# Patient Record
Sex: Female | Born: 1940
Health system: Southern US, Community
[De-identification: ages and names within clinical notes are randomized; demographics above are authoritative.]

## PROBLEM LIST (undated history)

## (undated) DIAGNOSIS — I10 Essential (primary) hypertension: Secondary | ICD-10-CM

## (undated) DIAGNOSIS — E039 Hypothyroidism, unspecified: Secondary | ICD-10-CM

## (undated) DIAGNOSIS — I483 Typical atrial flutter: Secondary | ICD-10-CM

## (undated) DIAGNOSIS — I5020 Unspecified systolic (congestive) heart failure: Secondary | ICD-10-CM

## (undated) DIAGNOSIS — C4359 Malignant melanoma of other part of trunk: Secondary | ICD-10-CM

## (undated) HISTORY — PX: OTHER SURGICAL HISTORY: SHX169

## (undated) HISTORY — DX: Essential (primary) hypertension: I10

## (undated) HISTORY — DX: Hypothyroidism, unspecified: E03.9

## (undated) HISTORY — DX: Typical atrial flutter: I48.3

## (undated) HISTORY — PX: KNEE ARTHROSCOPY: SUR90

## (undated) HISTORY — PX: TONSILLECTOMY: SUR1361

## (undated) HISTORY — DX: Unspecified systolic (congestive) heart failure: I50.20

## (undated) HISTORY — PX: ABDOMINAL HYSTERECTOMY: SHX81

## (undated) HISTORY — PX: CHOLECYSTECTOMY: SHX55

---

## 2011-06-10 DIAGNOSIS — E782 Mixed hyperlipidemia: Secondary | ICD-10-CM | POA: Diagnosis not present

## 2011-06-15 DIAGNOSIS — E669 Obesity, unspecified: Secondary | ICD-10-CM | POA: Diagnosis not present

## 2011-06-15 DIAGNOSIS — IMO0002 Reserved for concepts with insufficient information to code with codable children: Secondary | ICD-10-CM | POA: Diagnosis not present

## 2011-06-15 DIAGNOSIS — J309 Allergic rhinitis, unspecified: Secondary | ICD-10-CM | POA: Diagnosis not present

## 2011-06-15 DIAGNOSIS — E039 Hypothyroidism, unspecified: Secondary | ICD-10-CM | POA: Diagnosis not present

## 2011-06-15 DIAGNOSIS — L2089 Other atopic dermatitis: Secondary | ICD-10-CM | POA: Diagnosis not present

## 2011-06-15 DIAGNOSIS — E782 Mixed hyperlipidemia: Secondary | ICD-10-CM | POA: Diagnosis not present

## 2011-12-08 DIAGNOSIS — E7800 Pure hypercholesterolemia, unspecified: Secondary | ICD-10-CM | POA: Diagnosis not present

## 2011-12-08 DIAGNOSIS — E039 Hypothyroidism, unspecified: Secondary | ICD-10-CM | POA: Diagnosis not present

## 2011-12-08 DIAGNOSIS — E782 Mixed hyperlipidemia: Secondary | ICD-10-CM | POA: Diagnosis not present

## 2011-12-08 DIAGNOSIS — E669 Obesity, unspecified: Secondary | ICD-10-CM | POA: Diagnosis not present

## 2011-12-15 DIAGNOSIS — L2089 Other atopic dermatitis: Secondary | ICD-10-CM | POA: Diagnosis not present

## 2011-12-15 DIAGNOSIS — IMO0002 Reserved for concepts with insufficient information to code with codable children: Secondary | ICD-10-CM | POA: Diagnosis not present

## 2011-12-15 DIAGNOSIS — E782 Mixed hyperlipidemia: Secondary | ICD-10-CM | POA: Diagnosis not present

## 2011-12-15 DIAGNOSIS — E039 Hypothyroidism, unspecified: Secondary | ICD-10-CM | POA: Diagnosis not present

## 2011-12-15 DIAGNOSIS — J309 Allergic rhinitis, unspecified: Secondary | ICD-10-CM | POA: Diagnosis not present

## 2011-12-15 DIAGNOSIS — E669 Obesity, unspecified: Secondary | ICD-10-CM | POA: Diagnosis not present

## 2012-02-26 DIAGNOSIS — Z23 Encounter for immunization: Secondary | ICD-10-CM | POA: Diagnosis not present

## 2012-04-08 DIAGNOSIS — Z1231 Encounter for screening mammogram for malignant neoplasm of breast: Secondary | ICD-10-CM | POA: Diagnosis not present

## 2012-04-13 DIAGNOSIS — D1800 Hemangioma unspecified site: Secondary | ICD-10-CM | POA: Diagnosis not present

## 2012-04-13 DIAGNOSIS — L821 Other seborrheic keratosis: Secondary | ICD-10-CM | POA: Diagnosis not present

## 2012-04-13 DIAGNOSIS — L570 Actinic keratosis: Secondary | ICD-10-CM | POA: Diagnosis not present

## 2012-06-09 DIAGNOSIS — E782 Mixed hyperlipidemia: Secondary | ICD-10-CM | POA: Diagnosis not present

## 2012-06-17 DIAGNOSIS — J309 Allergic rhinitis, unspecified: Secondary | ICD-10-CM | POA: Diagnosis not present

## 2012-06-17 DIAGNOSIS — E782 Mixed hyperlipidemia: Secondary | ICD-10-CM | POA: Diagnosis not present

## 2012-06-17 DIAGNOSIS — E039 Hypothyroidism, unspecified: Secondary | ICD-10-CM | POA: Diagnosis not present

## 2012-07-25 DIAGNOSIS — S139XXA Sprain of joints and ligaments of unspecified parts of neck, initial encounter: Secondary | ICD-10-CM | POA: Diagnosis not present

## 2012-09-22 DIAGNOSIS — G43909 Migraine, unspecified, not intractable, without status migrainosus: Secondary | ICD-10-CM | POA: Diagnosis not present

## 2012-09-23 DIAGNOSIS — G44309 Post-traumatic headache, unspecified, not intractable: Secondary | ICD-10-CM | POA: Diagnosis not present

## 2012-12-22 DIAGNOSIS — E669 Obesity, unspecified: Secondary | ICD-10-CM | POA: Diagnosis not present

## 2012-12-22 DIAGNOSIS — E039 Hypothyroidism, unspecified: Secondary | ICD-10-CM | POA: Diagnosis not present

## 2012-12-22 DIAGNOSIS — E782 Mixed hyperlipidemia: Secondary | ICD-10-CM | POA: Diagnosis not present

## 2012-12-28 DIAGNOSIS — E039 Hypothyroidism, unspecified: Secondary | ICD-10-CM | POA: Diagnosis not present

## 2012-12-28 DIAGNOSIS — Z0000 Encounter for general adult medical examination without abnormal findings: Secondary | ICD-10-CM | POA: Diagnosis not present

## 2012-12-28 DIAGNOSIS — J309 Allergic rhinitis, unspecified: Secondary | ICD-10-CM | POA: Diagnosis not present

## 2012-12-28 DIAGNOSIS — E669 Obesity, unspecified: Secondary | ICD-10-CM | POA: Diagnosis not present

## 2012-12-28 DIAGNOSIS — E782 Mixed hyperlipidemia: Secondary | ICD-10-CM | POA: Diagnosis not present

## 2012-12-29 ENCOUNTER — Encounter (INDEPENDENT_AMBULATORY_CARE_PROVIDER_SITE_OTHER): Payer: Self-pay | Admitting: *Deleted

## 2013-02-17 DIAGNOSIS — Z23 Encounter for immunization: Secondary | ICD-10-CM | POA: Diagnosis not present

## 2013-03-09 ENCOUNTER — Encounter (INDEPENDENT_AMBULATORY_CARE_PROVIDER_SITE_OTHER): Payer: Self-pay | Admitting: *Deleted

## 2013-03-09 ENCOUNTER — Telehealth (INDEPENDENT_AMBULATORY_CARE_PROVIDER_SITE_OTHER): Payer: Self-pay | Admitting: *Deleted

## 2013-03-09 ENCOUNTER — Other Ambulatory Visit (INDEPENDENT_AMBULATORY_CARE_PROVIDER_SITE_OTHER): Payer: Self-pay | Admitting: *Deleted

## 2013-03-09 DIAGNOSIS — Z800 Family history of malignant neoplasm of digestive organs: Secondary | ICD-10-CM

## 2013-03-09 DIAGNOSIS — Z1211 Encounter for screening for malignant neoplasm of colon: Secondary | ICD-10-CM

## 2013-03-09 NOTE — Telephone Encounter (Signed)
Patient needs movi prep 

## 2013-03-10 MED ORDER — PEG-KCL-NACL-NASULF-NA ASC-C 100 G PO SOLR: 1.0000 | Freq: Once | ORAL | Status: DC

## 2013-03-30 ENCOUNTER — Ambulatory Visit (INDEPENDENT_AMBULATORY_CARE_PROVIDER_SITE_OTHER): Payer: Medicare Other | Admitting: Internal Medicine

## 2013-03-30 ENCOUNTER — Encounter (INDEPENDENT_AMBULATORY_CARE_PROVIDER_SITE_OTHER): Payer: Self-pay | Admitting: Internal Medicine

## 2013-03-30 VITALS — BP 170/68 | HR 64 | Temp 97.6°F | Ht 65.0 in | Wt 175.1 lb

## 2013-03-30 DIAGNOSIS — I10 Essential (primary) hypertension: Secondary | ICD-10-CM | POA: Insufficient documentation

## 2013-03-30 DIAGNOSIS — R1310 Dysphagia, unspecified: Secondary | ICD-10-CM

## 2013-03-30 DIAGNOSIS — E039 Hypothyroidism, unspecified: Secondary | ICD-10-CM | POA: Insufficient documentation

## 2013-03-30 NOTE — Progress Notes (Signed)
Subjective:     Patient ID: Kaylee Gilbert, female   DOB: 1941-03-29, 71 y.o.   MRN: [id number]  HPI Kaylee Gilbert is a 72 yr old female here today. She is scheduled for a screening colonoscopy 05/17/2013 for family hx of colon cancer (father age 89). Today, she c/o choking sensation on occasion when she eats..  Foods feel like they are lodging in her esophagus. When this occurs she cannot even swallowing water.  Occuring once a month. No foods in particular.  She has to sit still for the food to go down.  Appetite is good. No unintentional weight loss. No abdominal pain. She occasionally indigestion especially with fried foods. She takes Tums when needed. She usually has a BM daily. No melena or bright red rectal bleeding.  Review of Systems Current Outpatient Prescriptions  Medication Sig Dispense Refill  . cetirizine (ZYRTEC) 10 MG tablet Take 10 mg by mouth daily.      Marland Kitchen levothyroxine (SYNTHROID, LEVOTHROID) 75 MCG tablet Take 75 mcg by mouth daily before breakfast.      . peg 3350 powder (MOVIPREP) 100 G SOLR Take 1 kit (200 g total) by mouth once.  1 kit  0   No current facility-administered medications for this visit.   Past Medical History  Diagnosis Date  . Hypertension   . Hypothyroid    Past Surgical History  Procedure Laterality Date  . Knee arthroscopy      RT 1998  . Tonsillectomy    . Complete hysterectomy      2004, fibroids, prolapse  . Cholecystectomy  around 2010   No Known Allergies      Objective:   Physical Exam  Filed Vitals:   03/30/13 0944  BP: 170/68  Pulse: 64  Temp: 97.6 F (36.4 C)  Height: 5\' 5"  (1.651 m)  Weight: 175 lb 1.6 oz (79.425 kg)   Alert and oriented. Skin warm and dry. Oral mucosa is moist.   . Sclera anicteric, conjunctivae is pink. Thyroid not enlarged. No cervical lymphadenopathy. Lungs clear. Heart regular rate and rhythm.  Abdomen is soft. Bowel sounds are positive. No hepatomegaly. No abdominal masses felt. No tenderness.  No edema  to lower extremities.       Assessment:   Solid food Dysphagia which is not occurring frequently. Occurs about once a month. Stricture needs to be ruled out.     Plan:    Esophagram. Further recommendations to follow

## 2013-03-30 NOTE — Patient Instructions (Signed)
Esophagram.  

## 2013-03-31 ENCOUNTER — Ambulatory Visit (HOSPITAL_COMMUNITY)
Admission: RE | Admit: 2013-03-31 | Discharge: 2013-03-31 | Disposition: A | Discharge status: Home / Self Care | Payer: Medicare Other | Source: Ambulatory Visit | Attending: Internal Medicine | Admitting: Internal Medicine

## 2013-03-31 DIAGNOSIS — K219 Gastro-esophageal reflux disease without esophagitis: Secondary | ICD-10-CM | POA: Diagnosis not present

## 2013-03-31 DIAGNOSIS — R1310 Dysphagia, unspecified: Secondary | ICD-10-CM | POA: Diagnosis not present

## 2013-03-31 DIAGNOSIS — K224 Dyskinesia of esophagus: Secondary | ICD-10-CM | POA: Insufficient documentation

## 2013-04-01 DIAGNOSIS — C4359 Malignant melanoma of other part of trunk: Secondary | ICD-10-CM

## 2013-04-01 HISTORY — DX: Malignant melanoma of other part of trunk: C43.59

## 2013-04-10 DIAGNOSIS — Z1231 Encounter for screening mammogram for malignant neoplasm of breast: Secondary | ICD-10-CM | POA: Diagnosis not present

## 2013-04-17 DIAGNOSIS — D485 Neoplasm of uncertain behavior of skin: Secondary | ICD-10-CM | POA: Diagnosis not present

## 2013-04-17 DIAGNOSIS — L570 Actinic keratosis: Secondary | ICD-10-CM | POA: Diagnosis not present

## 2013-04-17 DIAGNOSIS — L821 Other seborrheic keratosis: Secondary | ICD-10-CM | POA: Diagnosis not present

## 2013-04-19 ENCOUNTER — Telehealth (INDEPENDENT_AMBULATORY_CARE_PROVIDER_SITE_OTHER): Payer: Self-pay | Admitting: *Deleted

## 2013-04-19 NOTE — Telephone Encounter (Signed)
  Procedure: tcs   Reason/Indication:  fam hx colon ca  Has patient had this procedure before?  Yes, 7-8 yrs ago  If so, when, by whom and where?    Is there a family history of colon cancer?  father  Who?  What age when diagnosed?    Is patient diabetic?   no      Does patient have prosthetic heart valve?  no  Do you have a pacemaker?  no  Has patient ever had endocarditis? no  Has patient had joint replacement within last 12 months?  no  Does patient tend to be constipated or take laxatives? no  Is patient on Coumadin, Plavix and/or Aspirin? yes  Medications: asa prn, zyrtec daily, levothyroxine 75 mcg daily  Allergies: nkda  Medication Adjustment: asa 2 days  Procedure date & time: 05/17/13 at 730

## 2013-04-19 NOTE — Telephone Encounter (Signed)
agree

## 2013-04-25 DIAGNOSIS — D485 Neoplasm of uncertain behavior of skin: Secondary | ICD-10-CM | POA: Diagnosis not present

## 2013-05-03 ENCOUNTER — Encounter (HOSPITAL_COMMUNITY): Payer: Self-pay | Admitting: Pharmacy Technician

## 2013-05-11 ENCOUNTER — Encounter (INDEPENDENT_AMBULATORY_CARE_PROVIDER_SITE_OTHER): Payer: Self-pay

## 2013-05-17 ENCOUNTER — Encounter (HOSPITAL_COMMUNITY): Payer: Self-pay | Admitting: *Deleted

## 2013-05-17 ENCOUNTER — Encounter (HOSPITAL_COMMUNITY)
Admission: RE | Disposition: A | Discharge status: Home / Self Care | Payer: Self-pay | Source: Ambulatory Visit | Attending: Internal Medicine

## 2013-05-17 ENCOUNTER — Ambulatory Visit (HOSPITAL_COMMUNITY)
Admission: RE | Admit: 2013-05-17 | Discharge: 2013-05-17 | Disposition: A | Discharge status: Home / Self Care | Payer: Medicare Other | Source: Ambulatory Visit | Attending: Internal Medicine | Admitting: Internal Medicine

## 2013-05-17 DIAGNOSIS — I10 Essential (primary) hypertension: Secondary | ICD-10-CM | POA: Insufficient documentation

## 2013-05-17 DIAGNOSIS — K5730 Diverticulosis of large intestine without perforation or abscess without bleeding: Secondary | ICD-10-CM | POA: Insufficient documentation

## 2013-05-17 DIAGNOSIS — Z800 Family history of malignant neoplasm of digestive organs: Secondary | ICD-10-CM | POA: Insufficient documentation

## 2013-05-17 DIAGNOSIS — Z1211 Encounter for screening for malignant neoplasm of colon: Secondary | ICD-10-CM | POA: Diagnosis not present

## 2013-05-17 DIAGNOSIS — D126 Benign neoplasm of colon, unspecified: Secondary | ICD-10-CM | POA: Diagnosis not present

## 2013-05-17 HISTORY — PX: COLONOSCOPY: SHX5424

## 2013-05-17 HISTORY — DX: Malignant melanoma of other part of trunk: C43.59

## 2013-05-17 SURGERY — COLONOSCOPY
Anesthesia: Moderate Sedation

## 2013-05-17 MED ORDER — MEPERIDINE HCL 50 MG/ML IJ SOLN
INTRAMUSCULAR | Status: AC
  Filled 2013-05-17: qty 1

## 2013-05-17 MED ORDER — MIDAZOLAM HCL 5 MG/5ML IJ SOLN
INTRAMUSCULAR | Status: AC
  Filled 2013-05-17: qty 10

## 2013-05-17 MED ORDER — STERILE WATER FOR IRRIGATION IR SOLN
Status: DC | PRN
  Administered 2013-05-17: 07:00:00

## 2013-05-17 MED ORDER — SODIUM CHLORIDE 0.9 % IV SOLN
INTRAVENOUS | Status: DC
  Administered 2013-05-17: 1000 mL via INTRAVENOUS

## 2013-05-17 MED ORDER — MIDAZOLAM HCL 5 MG/5ML IJ SOLN
INTRAMUSCULAR | Status: DC | PRN
  Administered 2013-05-17 (×2): 2 mg via INTRAVENOUS
  Administered 2013-05-17: 1 mg via INTRAVENOUS

## 2013-05-17 MED ORDER — MEPERIDINE HCL 50 MG/ML IJ SOLN
INTRAMUSCULAR | Status: DC | PRN
  Administered 2013-05-17 (×2): 25 mg via INTRAVENOUS

## 2013-05-17 NOTE — Op Note (Signed)
COLONOSCOPY PROCEDURE REPORT  PATIENT:  Kaylee Gilbert  MR#:  [id number] Birthdate:  [birth year], 72 y.o., female Endoscopist:  Dr. Malissa Hippo, MD Referred By:  Dr. Donzetta Sprung, MD Procedure Date: 05/17/2013  Procedure:   Colonoscopy  Indications:  Patient is 72 year old Caucasian female is undergoing screening colonoscopy. Family history significant for colon carcinoma in her father but he was 6 at the time of diagnosis.  Informed Consent:  The procedure and risks were reviewed with the patient and informed consent was obtained.  Medications:  Demerol 50 mg IV Versed 5 mg IV  Description of procedure:  After a digital rectal exam was performed, that colonoscope was advanced from the anus through the rectum and colon to the area of the cecum, ileocecal valve and appendiceal orifice. The cecum was deeply intubated. These structures were well-seen and photographed for the record. From the level of the cecum and ileocecal valve, the scope was slowly and cautiously withdrawn. The mucosal surfaces were carefully surveyed utilizing scope tip to flexion to facilitate fold flattening as needed. The scope was pulled down into the rectum where a thorough exam including retroflexion was performed.  Findings:   Prep satisfactory. 10 x 7 mm broad-based polyp snared from cecum. It was located to the left of appendiceal orifice. Polyp broke into pieces and was suctioned through scope channel. Two clips applied to polypectomy site(distinct and hemoclip). Scattered diverticula and sigmoid and descending colon. Normal mucosa rectum and anorectal junction.   Therapeutic/Diagnostic Maneuvers Performed:  See above  Complications:  None  Cecal Withdrawal Time:  18 minutes  Impression:  Examination performed to cecum. 10 x 7 mm broad-based polyp snared from cecum and 2 clips applied to polypectomy site.  Two clips applied to polypectomy site. Mild left-sided colonic  diverticulosis.  Recommendations:  Standard instructions given. I will contact patient with biopsy results and further recommendations. Patient informed that she cannot have an MRI until clips have passed.  REHMAN,NAJEEB U  05/17/2013 8:18 AM  CC: Dr. Donzetta Sprung, MD & Dr. Bonnetta Barry ref. provider found

## 2013-05-17 NOTE — H&P (Addendum)
Kaylee Gilbert is an 72 y.o. female.   Chief Complaint:  Patient is here for colonoscopy. HPI: Patient is 72 year old Caucasian female who is here for screening colonoscopy. She denies abdominal pain rectal bleeding or change in her bowel habits. Family history significant for colon carcinoma in her father was 40 at the time of diagnosis.  Past Medical History  Diagnosis Date  . Hypothyroid   . Hypertension     requires no med, watches diet  . Melanoma of buttock 04/2013    2 sites removed by MD    Past Surgical History  Procedure Laterality Date  . Knee arthroscopy      RT 1998  . Tonsillectomy    . Complete hysterectomy      2004, fibroids, prolapse  . Cholecystectomy  around 2010    Family History  Problem Relation Age of Onset  . Congestive Heart Failure Mother   . Cancer - Colon Father   . Diabetes type II Brother    Social History:  reports that she quit smoking about 49 years ago. Her smoking use included Cigarettes. She smoked 0.00 packs per day. She does not have any smokeless tobacco history on file. She reports that she does not drink alcohol or use illicit drugs.  Allergies: No Known Allergies  Medications Prior to Admission  Medication Sig Dispense Refill  . levothyroxine (SYNTHROID, LEVOTHROID) 75 MCG tablet Take 75 mcg by mouth daily before breakfast.      . Multiple Vitamins-Minerals (HAIR/SKIN/NAILS PO) Take 1 tablet by mouth daily.      . peg 3350 powder (MOVIPREP) 100 G SOLR Take 1 kit (200 g total) by mouth once.  1 kit  0    No results found for this or any previous visit (from the past 48 hour(s)). No results found.  ROS  Blood pressure 138/60, pulse 81, temperature 98.1 F (36.7 C), temperature source Oral, resp. rate 18, height 5\' 5"  (1.651 m), weight 168 lb (76.204 kg), SpO2 98.00%. Physical Exam  Constitutional: She appears well-developed and well-nourished.  HENT:  Mouth/Throat: Oropharynx is clear and moist.  Eyes: Conjunctivae are  normal. No scleral icterus.  Neck: No thyromegaly present.  Cardiovascular: Normal rate, regular rhythm and normal heart sounds.   No murmur heard. Respiratory: Effort normal and breath sounds normal.  GI: Soft. She exhibits no mass. There is no tenderness.  Musculoskeletal: She exhibits no edema.  Lymphadenopathy:    She has no cervical adenopathy.  Neurological: She is alert.  Skin: Skin is warm and dry.     Assessment/Plan Screening colonoscopy. Family history of colon carcinoma at the late onset(father at age 56).  REHMAN,NAJEEB U 05/17/2013, 7:33 AM

## 2013-05-22 ENCOUNTER — Encounter (INDEPENDENT_AMBULATORY_CARE_PROVIDER_SITE_OTHER): Payer: Self-pay | Admitting: *Deleted

## 2013-05-22 ENCOUNTER — Encounter (HOSPITAL_COMMUNITY): Payer: Self-pay | Admitting: Internal Medicine

## 2013-06-27 DIAGNOSIS — IMO0002 Reserved for concepts with insufficient information to code with codable children: Secondary | ICD-10-CM | POA: Diagnosis not present

## 2013-06-27 DIAGNOSIS — E039 Hypothyroidism, unspecified: Secondary | ICD-10-CM | POA: Diagnosis not present

## 2013-06-27 DIAGNOSIS — E782 Mixed hyperlipidemia: Secondary | ICD-10-CM | POA: Diagnosis not present

## 2013-06-27 DIAGNOSIS — E669 Obesity, unspecified: Secondary | ICD-10-CM | POA: Diagnosis not present

## 2013-07-04 DIAGNOSIS — E039 Hypothyroidism, unspecified: Secondary | ICD-10-CM | POA: Diagnosis not present

## 2013-07-04 DIAGNOSIS — C439 Malignant melanoma of skin, unspecified: Secondary | ICD-10-CM | POA: Diagnosis not present

## 2013-07-04 DIAGNOSIS — Z0000 Encounter for general adult medical examination without abnormal findings: Secondary | ICD-10-CM | POA: Diagnosis not present

## 2013-07-04 DIAGNOSIS — J309 Allergic rhinitis, unspecified: Secondary | ICD-10-CM | POA: Diagnosis not present

## 2013-07-04 DIAGNOSIS — Z1331 Encounter for screening for depression: Secondary | ICD-10-CM | POA: Diagnosis not present

## 2013-07-04 DIAGNOSIS — E782 Mixed hyperlipidemia: Secondary | ICD-10-CM | POA: Diagnosis not present

## 2013-07-04 DIAGNOSIS — E669 Obesity, unspecified: Secondary | ICD-10-CM | POA: Diagnosis not present

## 2013-10-03 DIAGNOSIS — H2510 Age-related nuclear cataract, unspecified eye: Secondary | ICD-10-CM | POA: Diagnosis not present

## 2013-10-03 DIAGNOSIS — H26019 Infantile and juvenile cortical, lamellar, or zonular cataract, unspecified eye: Secondary | ICD-10-CM | POA: Diagnosis not present

## 2013-10-06 ENCOUNTER — Encounter (HOSPITAL_COMMUNITY): Payer: Self-pay | Admitting: Pharmacy Technician

## 2013-10-12 NOTE — Patient Instructions (Signed)
Your procedure is scheduled on:  10/24/2013  Report to [facility] at   9:00   AM.  Call this number if you have problems the morning of surgery: [phone]   Remember:   Do not eat or drink :After Midnight.    Take these medicines the morning of surgery with A SIP OF WATER: Levothyroxine and Zyrtec   Do not wear jewelry, make-up or nail polish.  Do not wear lotions, powders, or perfumes. You may wear deodorant.  Do not shave 48 hours prior to surgery.  Do not bring valuables to the hospital.  Contacts, dentures or bridgework may not be worn into surgery.  Patients discharged the day of surgery will not be allowed to drive home.  Name and phone number of your driver:    Please read over the following fact sheets that you were given: Pain Booklet, Surgical Site Infection Prevention, Anesthesia Post-op Instructions and Care and Recovery After Surgery  Cataract Surgery  A cataract is a clouding of the lens of the eye. When a lens becomes cloudy, vision is reduced based on the degree and nature of the clouding. Surgery may be needed to improve vision. Surgery removes the cloudy lens and usually replaces it with a substitute lens (intraocular lens, IOL). LET YOUR EYE DOCTOR KNOW ABOUT:  Allergies to food or medicine.   Medicines taken including herbs, eyedrops, over-the-counter medicines, and creams.   Use of steroids (by mouth or creams).   Previous problems with anesthetics or numbing medicine.   History of bleeding problems or blood clots.   Previous surgery.   Other health problems, including diabetes and kidney problems.   Possibility of pregnancy, if this applies.  RISKS AND COMPLICATIONS  Infection.   Inflammation of the eyeball (endophthalmitis) that can spread to both eyes (sympathetic ophthalmia).   Poor wound healing.   If an IOL is inserted, it can later fall out of proper position. This is very uncommon.   Clouding of the part of your eye that holds an IOL in  place. This is called an "after-cataract." These are uncommon, but easily treated.  BEFORE THE PROCEDURE  Do not eat or drink anything except small amounts of water for 8 to 12 before your surgery, or as directed by your caregiver.   Unless you are told otherwise, continue any eyedrops you have been prescribed.   Talk to your primary caregiver about all other medicines that you take (both prescription and non-prescription). In some cases, you may need to stop or change medicines near the time of your surgery. This is most important if you are taking blood-thinning medicine.Do not stop medicines unless you are told to do so.   Arrange for someone to drive you to and from the procedure.   Do not put contact lenses in either eye on the day of your surgery.  PROCEDURE There is more than one method for safely removing a cataract. Your doctor can explain the differences and help determine which is best for you. Phacoemulsification surgery is the most common form of cataract surgery.  An injection is given behind the eye or eyedrops are given to make this a painless procedure.   A small cut (incision) is made on the edge of the clear, dome-shaped surface that covers the front of the eye (cornea).   A tiny probe is painlessly inserted into the eye. This device gives off ultrasound waves that soften and break up the cloudy center of the lens. This makes it  easier for the cloudy lens to be removed by suction.   An IOL may be implanted.   The normal lens of the eye is covered by a clear capsule. Part of that capsule is intentionally left in the eye to support the IOL.   Your surgeon may or may not use stitches to close the incision.  There are other forms of cataract surgery that require a larger incision and stiches to close the eye. This approach is taken in cases where the doctor feels that the cataract cannot be easily removed using phacoemulsification. AFTER THE PROCEDURE  When an IOL is  implanted, it does not need care. It becomes a permanent part of your eye and cannot be seen or felt.   Your doctor will schedule follow-up exams to check on your progress.   Review your other medicines with your doctor to see which can be resumed after surgery.   Use eyedrops or take medicine as prescribed by your doctor.  Document Released: 05/07/2011 Document Reviewed: 05/04/2011 [facility] Patient Information 2012 [place].  .Cataract Surgery Care After Refer to this sheet in the next few weeks. These instructions provide you with information on caring for yourself after your procedure. Your caregiver may also give you more specific instructions. Your treatment has been planned according to current medical practices, but problems sometimes occur. Call your caregiver if you have any problems or questions after your procedure.  HOME CARE INSTRUCTIONS   Avoid strenuous activities as directed by your caregiver.   Ask your caregiver when you can resume driving.   Use eyedrops or other medicines to help healing and control pressure inside your eye as directed by your caregiver.   Only take over-the-counter or prescription medicines for pain, discomfort, or fever as directed by your caregiver.   Do not to touch or rub your eyes.   You may be instructed to use a protective shield during the first few days and nights after surgery. If not, wear sunglasses to protect your eyes. This is to protect the eye from pressure or from being accidentally bumped.   Keep the area around your eye clean and dry. Avoid swimming or allowing water to hit you directly in the face while showering. Keep soap and shampoo out of your eyes.   Do not bend or lift heavy objects. Bending increases pressure in the eye. You can walk, climb stairs, and do light household chores.   Do not put a contact lens into the eye that had surgery until your caregiver says it is okay to do so.   Ask your doctor when you can  return to work. This will depend on the kind of work that you do. If you work in a dusty environment, you may be advised to wear protective eyewear for a period of time.   Ask your caregiver when it will be safe to engage in sexual activity.   Continue with your regular eye exams as directed by your caregiver.  What to expect:  It is normal to feel itching and mild discomfort for a few days after cataract surgery. Some fluid discharge is also common, and your eye may be sensitive to light and touch.   After 1 to 2 days, even moderate discomfort should disappear. In most cases, healing will take about 6 weeks.   If you received an intraocular lens (IOL), you may notice that colors are very bright or have a blue tinge. Also, if you have been in bright sunlight, everything  may appear reddish for a few hours. If you see these color tinges, it is because your lens is clear and no longer cloudy. Within a few months after receiving an IOL, these extra colors should go away. When you have healed, you will probably need new glasses.  SEEK MEDICAL CARE IF:   You have increased bruising around your eye.   You have discomfort not helped by medicine.  SEEK IMMEDIATE MEDICAL CARE IF:   You have a fever.   You have a worsening or sudden vision loss.   You have redness, swelling, or increasing pain in the eye.   You have a thick discharge from the eye that had surgery.  MAKE SURE YOU:  Understand these instructions.   Will watch your condition.   Will get help right away if you are not doing well or get worse.  Document Released: 12/05/2004 Document Revised: 05/07/2011 Document Reviewed: 01/09/2011 [facility] Patient Information 2012 [place].    Monitored Anesthesia Care  Monitored anesthesia care is an anesthesia service for a medical procedure. Anesthesia is the loss of the ability to feel pain. It is produced by medications called anesthetics. It may affect a small area of your body  (local anesthesia), a large area of your body (regional anesthesia), or your entire body (general anesthesia). The need for monitored anesthesia care depends your procedure, your condition, and the potential need for regional or general anesthesia. It is often provided during procedures where:   General anesthesia may be needed if there are complications. This is because you need special care when you are under general anesthesia.   You will be under local or regional anesthesia. This is so that you are able to have higher levels of anesthesia if needed.   You will receive calming medications (sedatives). This is especially the case if sedatives are given to put you in a semi-conscious state of relaxation (deep sedation). This is because the amount of sedative needed to produce this state can be hard to predict. Too much of a sedative can produce general anesthesia. Monitored anesthesia care is performed by one or more caregivers who have special training in all types of anesthesia. You will need to meet with these caregivers before your procedure. During this meeting, they will ask you about your medical history. They will also give you instructions to follow. (For example, you will need to stop eating and drinking before your procedure. You may also need to stop or change medications you are taking.) During your procedure, your caregivers will stay with you. They will:   Watch your condition. This includes watching you blood pressure, breathing, and level of pain.   Diagnose and treat problems that occur.   Give medications if they are needed. These may include calming medications (sedatives) and anesthetics.   Make sure you are comfortable.  Having monitored anesthesia care does not necessarily mean that you will be under anesthesia. It does mean that your caregivers will be able to manage anesthesia if you need it or if it occurs. It also means that you will be able to have a different type  of anesthesia than you are having if you need it. When your procedure is complete, your caregivers will continue to watch your condition. They will make sure any medications wear off before you are allowed to go home.  Document Released: 02/11/2005 Document Revised: 09/12/2012 Document Reviewed: 06/29/2012 [facility] Patient Information 2014 [place], [place].

## 2013-10-13 ENCOUNTER — Encounter (HOSPITAL_COMMUNITY)
Admission: RE | Admit: 2013-10-13 | Discharge: 2013-10-13 | Disposition: A | Discharge status: Home / Self Care | Payer: Medicare Other | Source: Ambulatory Visit | Attending: Ophthalmology | Admitting: Ophthalmology

## 2013-10-13 ENCOUNTER — Encounter (HOSPITAL_COMMUNITY): Payer: Self-pay

## 2013-10-13 ENCOUNTER — Other Ambulatory Visit: Payer: Self-pay

## 2013-10-13 DIAGNOSIS — Z01810 Encounter for preprocedural cardiovascular examination: Secondary | ICD-10-CM | POA: Insufficient documentation

## 2013-10-13 DIAGNOSIS — Z01812 Encounter for preprocedural laboratory examination: Secondary | ICD-10-CM | POA: Diagnosis not present

## 2013-10-13 LAB — BASIC METABOLIC PANEL
BUN: 18 mg/dL (ref 6–23)
CO2: 29 mEq/L (ref 19–32)
CREATININE: 0.64 mg/dL (ref 0.50–1.10)
Calcium: 9.7 mg/dL (ref 8.4–10.5)
Chloride: 105 mEq/L (ref 96–112)
GFR calc non Af Amer: 86 mL/min — ABNORMAL LOW (ref >90)
GLUCOSE: 98 mg/dL (ref 70–99)
Potassium: 4.4 mEq/L (ref 3.7–5.3)
Sodium: 144 mEq/L (ref 137–147)

## 2013-10-13 LAB — CBC WITH DIFFERENTIAL/PLATELET
BASOS ABS: 0 10*3/uL (ref 0.0–0.1)
BASOS PCT: 1 % (ref 0–1)
EOS PCT: 2 % (ref 0–5)
Eosinophils Absolute: 0.1 10*3/uL (ref 0.0–0.7)
HCT: 39 % (ref 36.0–46.0)
Hemoglobin: 12.9 g/dL (ref 12.0–15.0)
Lymphocytes Relative: 22 % (ref 12–46)
Lymphs Abs: 1.4 10*3/uL (ref 0.7–4.0)
MCH: 29.7 pg (ref 26.0–34.0)
MCHC: 33.1 g/dL (ref 30.0–36.0)
MCV: 89.9 fL (ref 78.0–100.0)
MONOS PCT: 9 % (ref 3–12)
Monocytes Absolute: 0.5 10*3/uL (ref 0.1–1.0)
NEUTROS ABS: 4.3 10*3/uL (ref 1.7–7.7)
Neutrophils Relative %: 66 % (ref 43–77)
Platelets: 214 10*3/uL (ref 150–400)
RBC: 4.34 MIL/uL (ref 3.87–5.11)
RDW: 12.8 % (ref 11.5–15.5)
WBC: 6.4 10*3/uL (ref 4.0–10.5)

## 2013-10-18 ENCOUNTER — Inpatient Hospital Stay (HOSPITAL_COMMUNITY): Admission: RE | Admit: 2013-10-18 | Payer: Medicare Other | Source: Ambulatory Visit

## 2013-10-18 ENCOUNTER — Encounter (HOSPITAL_COMMUNITY): Payer: Self-pay | Admitting: Pharmacy Technician

## 2013-10-24 ENCOUNTER — Ambulatory Visit (HOSPITAL_COMMUNITY)
Admission: RE | Admit: 2013-10-24 | Discharge: 2013-10-24 | Disposition: A | Discharge status: Home / Self Care | Payer: Medicare Other | Source: Ambulatory Visit | Attending: Ophthalmology | Admitting: Ophthalmology

## 2013-10-24 ENCOUNTER — Encounter (HOSPITAL_COMMUNITY): Payer: Medicare Other | Admitting: Anesthesiology

## 2013-10-24 ENCOUNTER — Encounter (HOSPITAL_COMMUNITY)
Admission: RE | Disposition: A | Discharge status: Home / Self Care | Payer: Self-pay | Source: Ambulatory Visit | Attending: Ophthalmology

## 2013-10-24 ENCOUNTER — Ambulatory Visit (HOSPITAL_COMMUNITY): Payer: Medicare Other | Admitting: Anesthesiology

## 2013-10-24 ENCOUNTER — Encounter (HOSPITAL_COMMUNITY): Payer: Self-pay | Admitting: *Deleted

## 2013-10-24 DIAGNOSIS — H2589 Other age-related cataract: Secondary | ICD-10-CM | POA: Insufficient documentation

## 2013-10-24 DIAGNOSIS — H2510 Age-related nuclear cataract, unspecified eye: Secondary | ICD-10-CM | POA: Diagnosis not present

## 2013-10-24 DIAGNOSIS — H269 Unspecified cataract: Secondary | ICD-10-CM | POA: Diagnosis not present

## 2013-10-24 DIAGNOSIS — H26019 Infantile and juvenile cortical, lamellar, or zonular cataract, unspecified eye: Secondary | ICD-10-CM | POA: Diagnosis not present

## 2013-10-24 HISTORY — PX: CATARACT EXTRACTION W/PHACO: SHX586

## 2013-10-24 SURGERY — PHACOEMULSIFICATION, CATARACT, WITH IOL INSERTION
Anesthesia: Monitor Anesthesia Care | Site: Eye | Laterality: Right

## 2013-10-24 MED ORDER — KETOROLAC TROMETHAMINE 0.5 % OP SOLN
OPHTHALMIC | Status: AC
  Filled 2013-10-24: qty 5

## 2013-10-24 MED ORDER — CYCLOPENTOLATE-PHENYLEPHRINE 0.2-1 % OP SOLN
1.0000 [drp] | OPHTHALMIC | Status: AC | PRN
  Administered 2013-10-24 (×3): 1 [drp] via OPHTHALMIC

## 2013-10-24 MED ORDER — ONDANSETRON HCL 4 MG/2ML IJ SOLN: 4.0000 mg | Freq: Once | INTRAMUSCULAR | Status: DC | PRN

## 2013-10-24 MED ORDER — MIDAZOLAM HCL 2 MG/2ML IJ SOLN
INTRAMUSCULAR | Status: AC
  Filled 2013-10-24: qty 2

## 2013-10-24 MED ORDER — PHENYLEPHRINE HCL 2.5 % OP SOLN
OPHTHALMIC | Status: AC
  Filled 2013-10-24: qty 15

## 2013-10-24 MED ORDER — TETRACAINE HCL 0.5 % OP SOLN
1.0000 [drp] | OPHTHALMIC | Status: AC | PRN
  Administered 2013-10-24 (×3): 1 [drp] via OPHTHALMIC

## 2013-10-24 MED ORDER — PHENYLEPHRINE HCL 2.5 % OP SOLN
1.0000 [drp] | OPHTHALMIC | Status: AC | PRN
  Administered 2013-10-24 (×3): 1 [drp] via OPHTHALMIC

## 2013-10-24 MED ORDER — LACTATED RINGERS IV SOLN
INTRAVENOUS | Status: DC
  Administered 2013-10-24: 1000 mL via INTRAVENOUS

## 2013-10-24 MED ORDER — BSS IO SOLN
INTRAOCULAR | Status: DC | PRN
  Administered 2013-10-24: 15 mL via INTRAOCULAR

## 2013-10-24 MED ORDER — EPINEPHRINE HCL 1 MG/ML IJ SOLN
INTRAMUSCULAR | Status: AC
  Filled 2013-10-24: qty 1

## 2013-10-24 MED ORDER — TETRACAINE 0.5 % OP SOLN OPTIME - NO CHARGE
OPHTHALMIC | Status: DC | PRN
  Administered 2013-10-24: 1 [drp] via OPHTHALMIC

## 2013-10-24 MED ORDER — TETRACAINE HCL 0.5 % OP SOLN
OPHTHALMIC | Status: AC
  Filled 2013-10-24: qty 2

## 2013-10-24 MED ORDER — CYCLOPENTOLATE-PHENYLEPHRINE OP SOLN OPTIME - NO CHARGE
OPHTHALMIC | Status: AC
  Filled 2013-10-24: qty 2

## 2013-10-24 MED ORDER — EPINEPHRINE HCL 1 MG/ML IJ SOLN
INTRAMUSCULAR | Status: DC | PRN
  Administered 2013-10-24: 10:00:00

## 2013-10-24 MED ORDER — MIDAZOLAM HCL 2 MG/2ML IJ SOLN
1.0000 mg | INTRAMUSCULAR | Status: DC | PRN
  Administered 2013-10-24: 2 mg via INTRAVENOUS

## 2013-10-24 MED ORDER — PROVISC 10 MG/ML IO SOLN
INTRAOCULAR | Status: DC | PRN
  Administered 2013-10-24: 0.85 mL via INTRAOCULAR

## 2013-10-24 MED ORDER — KETOROLAC TROMETHAMINE 0.5 % OP SOLN
1.0000 [drp] | OPHTHALMIC | Status: AC | PRN
  Administered 2013-10-24 (×3): 1 [drp] via OPHTHALMIC

## 2013-10-24 MED ORDER — FENTANYL CITRATE 0.05 MG/ML IJ SOLN
INTRAMUSCULAR | Status: AC
  Filled 2013-10-24: qty 2

## 2013-10-24 MED ORDER — FENTANYL CITRATE 0.05 MG/ML IJ SOLN: 25.0000 ug | INTRAMUSCULAR | Status: DC | PRN

## 2013-10-24 MED ORDER — FENTANYL CITRATE 0.05 MG/ML IJ SOLN
25.0000 ug | INTRAMUSCULAR | Status: AC
  Administered 2013-10-24 (×2): 25 ug via INTRAVENOUS

## 2013-10-24 SURGICAL SUPPLY — 25 items
CAPSULAR TENSION RING-AMO (OPHTHALMIC RELATED) IMPLANT
CLOTH BEACON ORANGE TIMEOUT ST (SAFETY) ×3 IMPLANT
EYE SHIELD UNIVERSAL CLEAR (GAUZE/BANDAGES/DRESSINGS) ×3 IMPLANT
GLOVE BIO SURGEON STRL SZ 6.5 (GLOVE) ×2 IMPLANT
GLOVE BIO SURGEONS STRL SZ 6.5 (GLOVE) ×1
GLOVE ECLIPSE 6.5 STRL STRAW (GLOVE) IMPLANT
GLOVE ECLIPSE 7.0 STRL STRAW (GLOVE) IMPLANT
GLOVE EXAM NITRILE LRG STRL (GLOVE) IMPLANT
GLOVE EXAM NITRILE MD LF STRL (GLOVE) IMPLANT
GLOVE SKINSENSE NS SZ6.5 (GLOVE)
GLOVE SKINSENSE STRL SZ6.5 (GLOVE) IMPLANT
GLOVE SS N UNI LF 7.0 STRL (GLOVE) ×3 IMPLANT
HEALON 5 0.6 ML (INTRAOCULAR LENS) IMPLANT
KIT VITRECTOMY (OPHTHALMIC RELATED) IMPLANT
PAD ARMBOARD 7.5X6 YLW CONV (MISCELLANEOUS) ×3 IMPLANT
PROC W NO LENS (INTRAOCULAR LENS)
PROC W SPEC LENS (INTRAOCULAR LENS)
PROCESS W NO LENS (INTRAOCULAR LENS) IMPLANT
PROCESS W SPEC LENS (INTRAOCULAR LENS) IMPLANT
RING MALYGIN (MISCELLANEOUS) IMPLANT
SIGHTPATH CAT PROC W REG LENS (Ophthalmic Related) ×3 IMPLANT
TAPE SURG TRANSPORE 1 IN (GAUZE/BANDAGES/DRESSINGS) ×1 IMPLANT
TAPE SURGICAL TRANSPORE 1 IN (GAUZE/BANDAGES/DRESSINGS) ×2
VISCOELASTIC ADDITIONAL (OPHTHALMIC RELATED) IMPLANT
WATER STERILE IRR 250ML POUR (IV SOLUTION) ×3 IMPLANT

## 2013-10-24 NOTE — Anesthesia Preprocedure Evaluation (Signed)
Anesthesia Evaluation  Patient identified by MRN, date of birth, ID band Patient awake    Reviewed: Allergy & Precautions, H&P , NPO status , Patient's Chart, lab work & pertinent test results  Airway Mallampati: II TM Distance: >3 FB Neck ROM: Full    Dental  (+) Edentulous Upper   Pulmonary former smoker,  breath sounds clear to auscultation        Cardiovascular hypertension, Rhythm:Regular Rate:Normal     Neuro/Psych    GI/Hepatic negative GI ROS,   Endo/Other  Hypothyroidism   Renal/GU      Musculoskeletal   Abdominal   Peds  Hematology   Anesthesia Other Findings   Reproductive/Obstetrics                           Anesthesia Physical Anesthesia Plan  ASA: II  Anesthesia Plan: MAC   Post-op Pain Management:    Induction: Intravenous  Airway Management Planned: Nasal Cannula  Additional Equipment:   Intra-op Plan:   Post-operative Plan:   Informed Consent: I have reviewed the patients History and Physical, chart, labs and discussed the procedure including the risks, benefits and alternatives for the proposed anesthesia with the patient or authorized representative who has indicated his/her understanding and acceptance.     Plan Discussed with:   Anesthesia Plan Comments:         Anesthesia Quick Evaluation

## 2013-10-24 NOTE — H&P (Signed)
The patient was re examined and there is no change in the patients condition since the original H and P. 

## 2013-10-24 NOTE — Discharge Instructions (Signed)
Kaylee Gilbert  10/24/2013           [facility] Instructions [place] [address]      1. Avoid closing eyes tightly. One often closes the eye tightly when laughing, talking, sneezing, coughing or if they feel irritated. At these times, you should be careful not to close your eyes tightly.  2. Instill eye drops as instructed. To instill drops in your eye, open it, look up and have someone gently pull the lower lid down and instill a couple of drops inside the lower lid.  3. Do not touch upper lid.  4. Take Advil or Tylenol for pain.  5. You may use either eye for near work, such as reading or sewing and you may watch television.  6. You may have your hair done at the beauty parlor at any time.  7. Wear dark glasses with or without your own glasses if you are in bright light.  8. Call our office at [phone] or [phone] if you have sharp pain in your eye or unusual symptoms.  9. Do not be concerned because vision in the operative eye is not good. It will not be good, no matter how successful the operation, until you get a special lens for it. Your old glasses will not be suited to the new eye that was operated on and you will not be ready for a new lens for about a month.  10. Follow up at the [facility] office.    I have received a copy of the above instructions and will follow them.      Follow up with Dr. Gershon Crane in his office today between 2-3 pm

## 2013-10-24 NOTE — Op Note (Signed)
Patient brought to the operating room and prepped and draped in the usual manner.  Lid speculum inserted in right eye.  Stab incision made at the twelve o'clock position.  Provisc instilled in the anterior chamber.   A 2.4 mm. Stab incision was made temporally.  An anterior capsulotomy was done with a bent 25 gauge needle.  The nucleus was hydrodissected.  The Phaco tip was inserted in the anterior chamber and the nucleus was emulsified.  CDE was 5.57.  The cortical material was then removed with the I and A tip.  Posterior capsule was the polished.  The anterior chamber was deepened with Provisc.  A 21.0 Diopter Rayner 570C IOL was then inserted in the capsular bag.  Provisc was then removed with the I and A tip.  The wound was then hydrated.  Patient sent to the Recovery Room in good condition with follow up in my office.  Preoperative Diagnosis:  Nuclear Cataract OD Postoperative Diagnosis:  Same Procedure name: Kelman Phacoemulsification OD with IOL

## 2013-10-24 NOTE — Anesthesia Postprocedure Evaluation (Signed)
  Anesthesia Post-op Note  Patient: Kaylee Gilbert  Procedure(s) Performed: Procedure(s) with comments: CATARACT EXTRACTION PHACO AND INTRAOCULAR LENS PLACEMENT (IOC) (Right) - CDE:  5.57  Patient Location: Short Stay  Anesthesia Type:MAC  Level of Consciousness: awake, alert  and oriented  Airway and Oxygen Therapy: Patient Spontanous Breathing  Post-op Pain: none  Post-op Assessment: Post-op Vital signs reviewed, Patient's Cardiovascular Status Stable, Respiratory Function Stable, Patent Airway and No signs of Nausea or vomiting  Post-op Vital Signs: Reviewed and stable  Last Vitals:  Filed Vitals:   10/24/13 1005  BP: 178/80  Resp: 20    Complications: No apparent anesthesia complications

## 2013-10-24 NOTE — Transfer of Care (Signed)
Immediate Anesthesia Transfer of Care Note  Patient: Kaylee Gilbert  Procedure(s) Performed: Procedure(s) with comments: CATARACT EXTRACTION PHACO AND INTRAOCULAR LENS PLACEMENT (IOC) (Right) - CDE:  5.57  Patient Location: Short Stay  Anesthesia Type:MAC  Level of Consciousness: awake  Airway & Oxygen Therapy: Patient Spontanous Breathing  Post-op Assessment: Report given to PACU RN  Post vital signs: Reviewed  Complications: No apparent anesthesia complications

## 2013-10-25 ENCOUNTER — Encounter (HOSPITAL_COMMUNITY): Payer: Self-pay | Admitting: Ophthalmology

## 2013-11-01 ENCOUNTER — Encounter (HOSPITAL_COMMUNITY): Payer: Medicare Other

## 2013-11-07 ENCOUNTER — Ambulatory Visit (HOSPITAL_COMMUNITY): Admission: RE | Admit: 2013-11-07 | Payer: Medicare Other | Source: Ambulatory Visit | Admitting: Ophthalmology

## 2013-11-07 ENCOUNTER — Encounter (HOSPITAL_COMMUNITY): Admission: RE | Payer: Self-pay | Source: Ambulatory Visit

## 2013-11-07 SURGERY — PHACOEMULSIFICATION, CATARACT, WITH IOL INSERTION
Anesthesia: Monitor Anesthesia Care | Laterality: Left

## 2014-01-01 DIAGNOSIS — H02839 Dermatochalasis of unspecified eye, unspecified eyelid: Secondary | ICD-10-CM | POA: Diagnosis not present

## 2014-01-01 DIAGNOSIS — H5200 Hypermetropia, unspecified eye: Secondary | ICD-10-CM | POA: Diagnosis not present

## 2014-01-01 DIAGNOSIS — Z961 Presence of intraocular lens: Secondary | ICD-10-CM | POA: Diagnosis not present

## 2014-01-01 DIAGNOSIS — H25099 Other age-related incipient cataract, unspecified eye: Secondary | ICD-10-CM | POA: Diagnosis not present

## 2014-01-02 DIAGNOSIS — E669 Obesity, unspecified: Secondary | ICD-10-CM | POA: Diagnosis not present

## 2014-01-02 DIAGNOSIS — E782 Mixed hyperlipidemia: Secondary | ICD-10-CM | POA: Diagnosis not present

## 2014-01-02 DIAGNOSIS — E039 Hypothyroidism, unspecified: Secondary | ICD-10-CM | POA: Diagnosis not present

## 2014-01-09 DIAGNOSIS — E782 Mixed hyperlipidemia: Secondary | ICD-10-CM | POA: Diagnosis not present

## 2014-01-09 DIAGNOSIS — Z0000 Encounter for general adult medical examination without abnormal findings: Secondary | ICD-10-CM | POA: Diagnosis not present

## 2014-01-09 DIAGNOSIS — E669 Obesity, unspecified: Secondary | ICD-10-CM | POA: Diagnosis not present

## 2014-01-09 DIAGNOSIS — C439 Malignant melanoma of skin, unspecified: Secondary | ICD-10-CM | POA: Diagnosis not present

## 2014-01-09 DIAGNOSIS — Z1331 Encounter for screening for depression: Secondary | ICD-10-CM | POA: Diagnosis not present

## 2014-01-09 DIAGNOSIS — E039 Hypothyroidism, unspecified: Secondary | ICD-10-CM | POA: Diagnosis not present

## 2014-01-09 DIAGNOSIS — J309 Allergic rhinitis, unspecified: Secondary | ICD-10-CM | POA: Diagnosis not present

## 2014-02-08 DIAGNOSIS — E039 Hypothyroidism, unspecified: Secondary | ICD-10-CM | POA: Diagnosis not present

## 2014-02-08 DIAGNOSIS — Z79899 Other long term (current) drug therapy: Secondary | ICD-10-CM | POA: Diagnosis not present

## 2014-02-08 DIAGNOSIS — Z780 Asymptomatic menopausal state: Secondary | ICD-10-CM | POA: Diagnosis not present

## 2014-02-08 DIAGNOSIS — M949 Disorder of cartilage, unspecified: Secondary | ICD-10-CM | POA: Diagnosis not present

## 2014-02-08 DIAGNOSIS — M899 Disorder of bone, unspecified: Secondary | ICD-10-CM | POA: Diagnosis not present

## 2014-02-27 DIAGNOSIS — Z23 Encounter for immunization: Secondary | ICD-10-CM | POA: Diagnosis not present

## 2014-04-17 DIAGNOSIS — L821 Other seborrheic keratosis: Secondary | ICD-10-CM | POA: Diagnosis not present

## 2014-04-17 DIAGNOSIS — L570 Actinic keratosis: Secondary | ICD-10-CM | POA: Diagnosis not present

## 2014-04-17 DIAGNOSIS — D1800 Hemangioma unspecified site: Secondary | ICD-10-CM | POA: Diagnosis not present

## 2014-04-24 DIAGNOSIS — Z1231 Encounter for screening mammogram for malignant neoplasm of breast: Secondary | ICD-10-CM | POA: Diagnosis not present

## 2014-07-12 DIAGNOSIS — E782 Mixed hyperlipidemia: Secondary | ICD-10-CM | POA: Diagnosis not present

## 2014-07-12 DIAGNOSIS — J301 Allergic rhinitis due to pollen: Secondary | ICD-10-CM | POA: Diagnosis not present

## 2014-07-12 DIAGNOSIS — Z9189 Other specified personal risk factors, not elsewhere classified: Secondary | ICD-10-CM | POA: Diagnosis not present

## 2014-07-12 DIAGNOSIS — Z1389 Encounter for screening for other disorder: Secondary | ICD-10-CM | POA: Diagnosis not present

## 2014-07-12 DIAGNOSIS — E038 Other specified hypothyroidism: Secondary | ICD-10-CM | POA: Diagnosis not present

## 2014-07-12 DIAGNOSIS — E6609 Other obesity due to excess calories: Secondary | ICD-10-CM | POA: Diagnosis not present

## 2014-08-23 IMAGING — RF DG ESOPHAGUS
14 of 24 series · 14 of 24 positions shown · non-contrast
Comparison: None

FLUOROSCOPY TIME:  1 min, 30 seconds

CLINICAL DATA: Dysphagia

EXAM:
ESOPHOGRAM / BARIUM SWALLOW / BARIUM TABLET STUDY
TECHNIQUE: Combined double contrast and single contrast examination performed
using effervescent crystals, thick barium liquid, and thin barium
liquid. The patient was observed with fluoroscopy swallowing a 13mm
barium sulphate tablet.

[Series 1: run · 1 of 4 slices shown (1 of 14)]
[im 1/4]
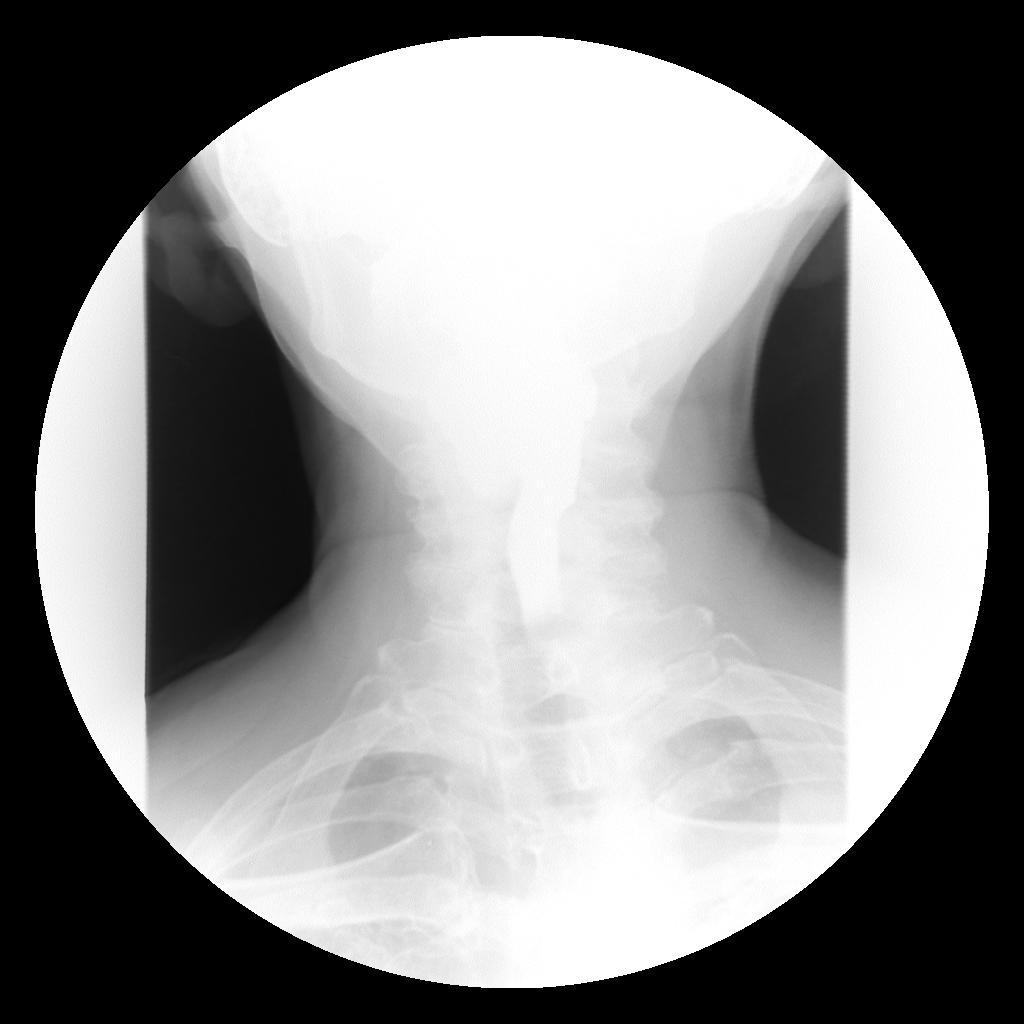

[Series 3: run · 1 of 1 slices shown (2 of 14)]
[im 1/1]
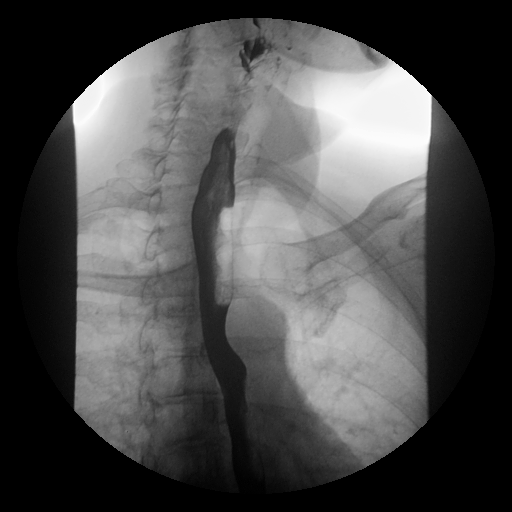

[Series 5: run · 1 of 1 slices shown (3 of 14)]
[im 1/1]
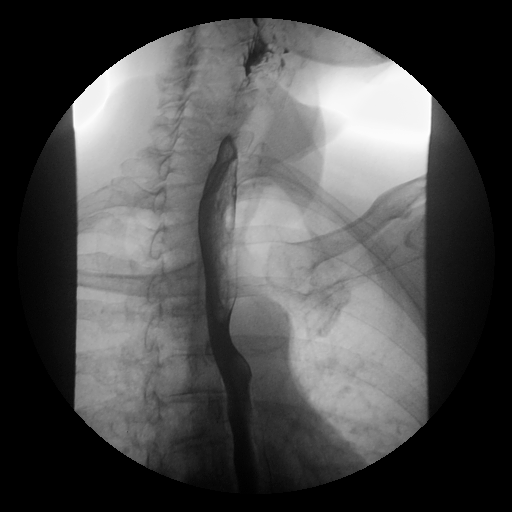

[Series 7: run · 1 of 1 slices shown (4 of 14)]
[im 1/1]
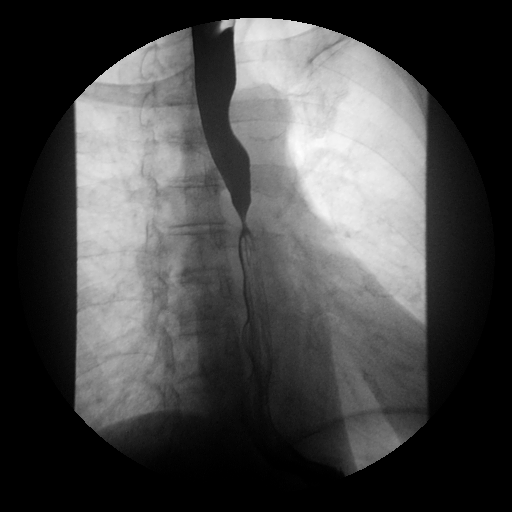

[Series 8: run · 1 of 1 slices shown (5 of 14)]
[im 1/1]
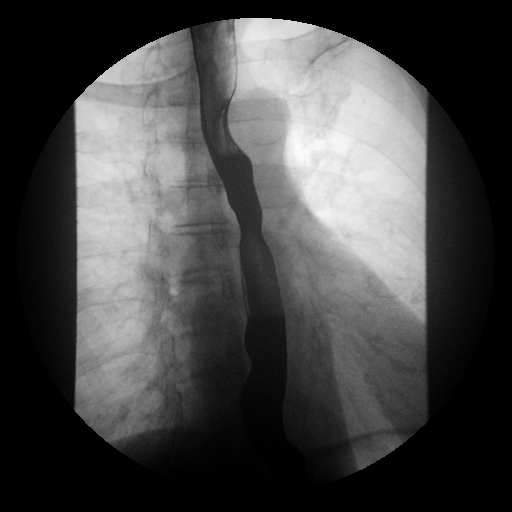

[Series 10: run · 1 of 3 slices shown (6 of 14)]
[im 1/3]
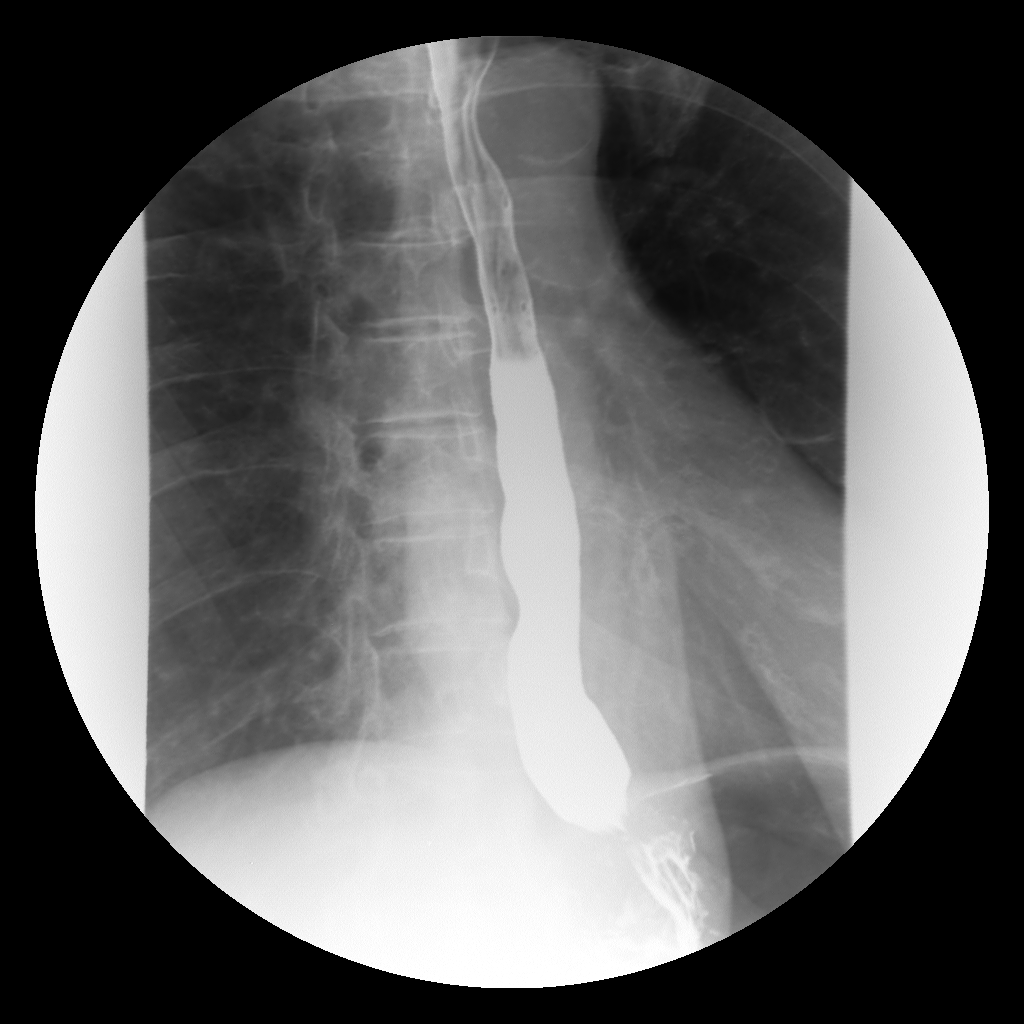

[Series 12: run · 1 of 1 slices shown (7 of 14)]
[im 1/1]
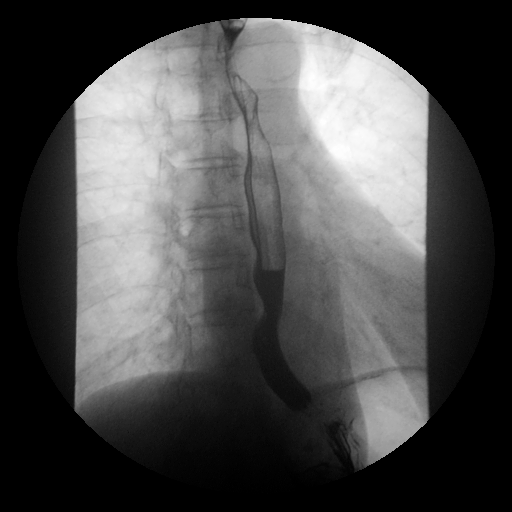

[Series 13: run · 1 of 1 slices shown (8 of 14)]
[im 1/1]
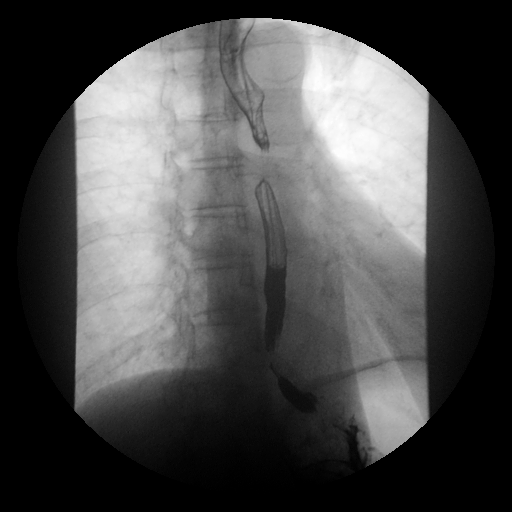

[Series 15: run · 1 of 1 slices shown (9 of 14)]
[im 1/1]
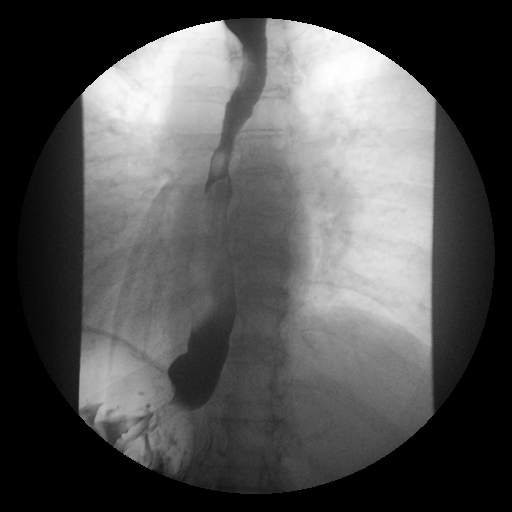

[Series 17: run · 1 of 1 slices shown (10 of 14)]
[im 1/1]
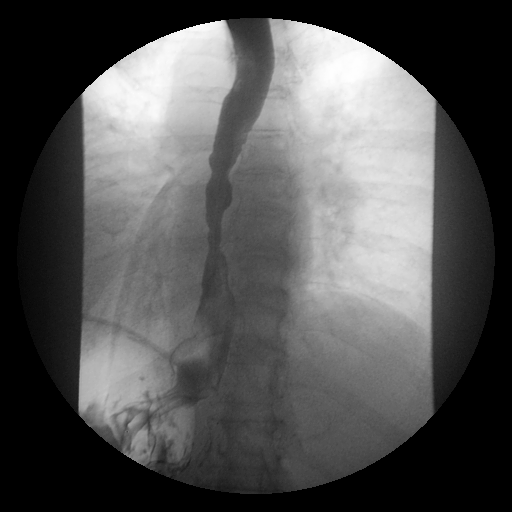

[Series 19: run · 1 of 1 slices shown (11 of 14)]
[im 1/1]
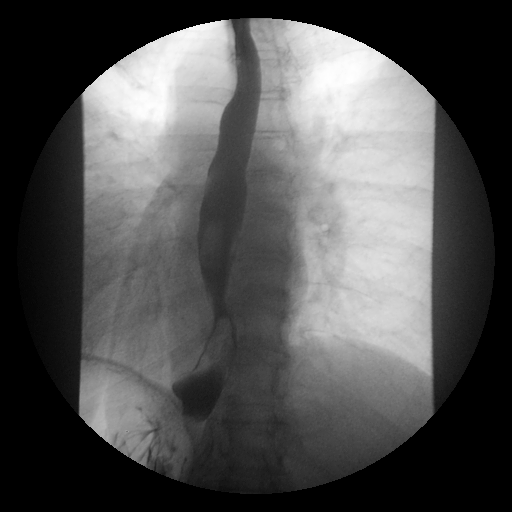

[Series 20: run · 1 of 1 slices shown (12 of 14)]
[im 1/1]
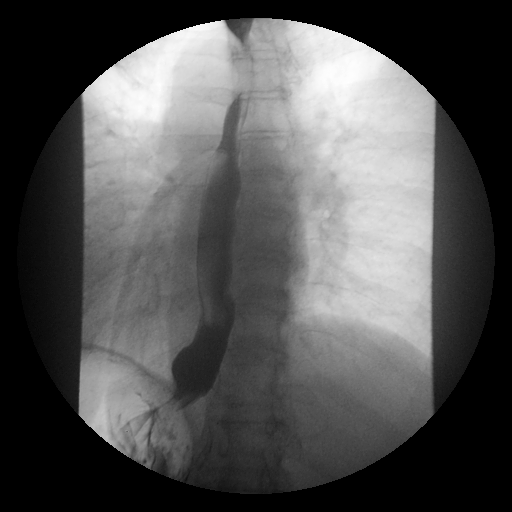

[Series 22: run · 1 of 1 slices shown (13 of 14)]
[im 1/1]
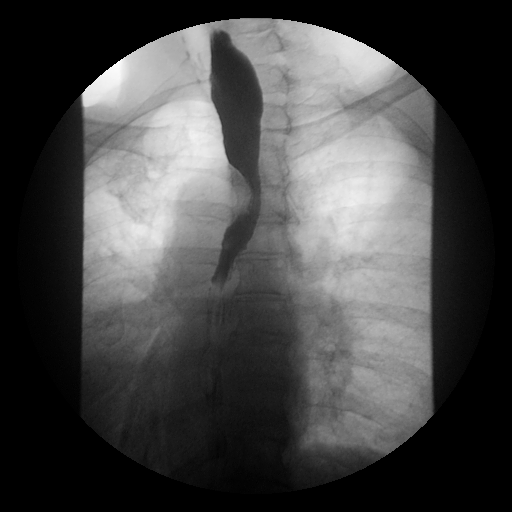

[Series 24: run · 1 of 1 slices shown (14 of 14)]
[im 1/1]
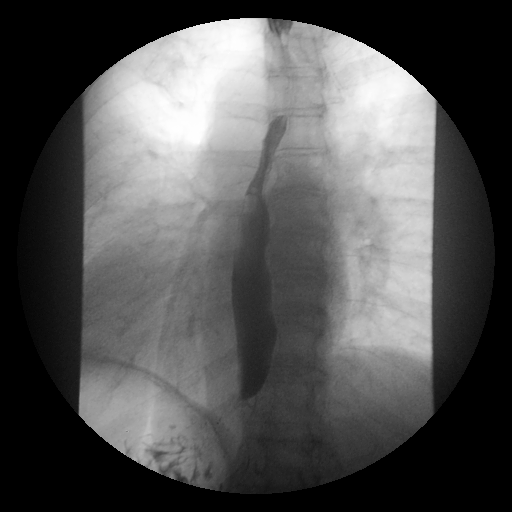

[14 of 24 positions shown; findings below may reference images not displayed]

FINDINGS: Fluoroscopic evaluation of swallowing demonstrates normal pharynx.
Mild impression on the posterior wall of the cervical esophagus due
to anterior spurring at C5-6 and C6-7. There is disruption [DATE] primary esophageal peristaltic waves. There are occasional
tertiary contractions. No fixed stricture, fold thickening or mass.
Small hiatal hernia noted. The patient swallowed a 13 mm barium
tablet which freely passed into the stomach and did not reproduce
the symptoms. There was a small amount of gastroesophageal reflux
with the water siphon maneuver.
IMPRESSION: Nonspecific esophageal motility disorder. Occasional scattered
tertiary contractions.

Gastroesophageal reflux.

## 2015-01-29 DIAGNOSIS — E782 Mixed hyperlipidemia: Secondary | ICD-10-CM | POA: Diagnosis not present

## 2015-01-29 DIAGNOSIS — E038 Other specified hypothyroidism: Secondary | ICD-10-CM | POA: Diagnosis not present

## 2015-01-29 DIAGNOSIS — E6609 Other obesity due to excess calories: Secondary | ICD-10-CM | POA: Diagnosis not present

## 2015-01-29 DIAGNOSIS — Z9189 Other specified personal risk factors, not elsewhere classified: Secondary | ICD-10-CM | POA: Diagnosis not present

## 2015-02-05 DIAGNOSIS — Z0001 Encounter for general adult medical examination with abnormal findings: Secondary | ICD-10-CM | POA: Diagnosis not present

## 2015-02-05 DIAGNOSIS — J301 Allergic rhinitis due to pollen: Secondary | ICD-10-CM | POA: Diagnosis not present

## 2015-02-05 DIAGNOSIS — E038 Other specified hypothyroidism: Secondary | ICD-10-CM | POA: Diagnosis not present

## 2015-02-05 DIAGNOSIS — Z23 Encounter for immunization: Secondary | ICD-10-CM | POA: Diagnosis not present

## 2015-02-05 DIAGNOSIS — E782 Mixed hyperlipidemia: Secondary | ICD-10-CM | POA: Diagnosis not present

## 2015-04-16 DIAGNOSIS — L570 Actinic keratosis: Secondary | ICD-10-CM | POA: Diagnosis not present

## 2015-05-03 DIAGNOSIS — Z1231 Encounter for screening mammogram for malignant neoplasm of breast: Secondary | ICD-10-CM | POA: Diagnosis not present

## 2015-05-20 DIAGNOSIS — H5202 Hypermetropia, left eye: Secondary | ICD-10-CM | POA: Diagnosis not present

## 2015-05-20 DIAGNOSIS — H2512 Age-related nuclear cataract, left eye: Secondary | ICD-10-CM | POA: Diagnosis not present

## 2015-05-20 DIAGNOSIS — H40013 Open angle with borderline findings, low risk, bilateral: Secondary | ICD-10-CM | POA: Diagnosis not present

## 2015-05-20 DIAGNOSIS — H26491 Other secondary cataract, right eye: Secondary | ICD-10-CM | POA: Diagnosis not present

## 2015-06-21 NOTE — Patient Instructions (Signed)
Kaylee Gilbert  06/21/2015     @[email or web address] @    Your procedure is scheduled on:  06/27/2015  Report to Day Surgery at 6:15 AM.  Please call [phone] after 12 noon the day before your procedure to receive your arrival time.                       Remember:             Do not eat food or drink liquids after midnight.   Take these medicines the morning of surgery with A SIP OF WATER: Synthroid and zyrtec  Do not wear jewelry, make-up or nail polish.  Do not wear lotions, powders, or perfumes. You may wear deodorant.  Do not shave 48 hours prior to surgery. Men may shave face and neck.  Do not bring valuables to the hospital.               [facility] is not responsible for any belongings or valuables.               Contacts, dentures or bridgework may not be worn into surgery.  Leave suitcase in the car. After surgery it may be brought to your room.                          For patients admitted to the hospital, discharge time              is determined by your treatment team.               Patients discharged the day of surgery will not be allowed to drive home.               Please read over the following fact sheets that you were given: Cataract A cataract is a clouding of the lens of the eye. When a lens becomes cloudy, vision is reduced based on the degree and nature of the clouding. Many cataracts reduce vision to some degree. Some cataracts make people more near-sighted as they develop. Other cataracts increase glare. Cataracts that are ignored and become worse can sometimes look white. The white color can be seen through the pupil. CAUSES   Aging. However, cataracts may occur at any age, even in newborns.  Certain drugs.  Trauma to the eye.  Certain diseases such as diabetes.  Specific eye diseases such as chronic inflammation inside the eye or a sudden attack of a rare form of glaucoma.  Inherited or acquired medical problems. SYMPTOMS    Gradual, progressive drop in vision in the affected eye.  Severe, rapid visual loss. This most often happens when trauma is the cause. DIAGNOSIS  To detect a cataract, an eye doctor examines the lens. Cataracts are best diagnosed with an exam of the eyes with the pupils enlarged (dilated) by drops.  TREATMENT  For an early cataract, vision may improve by using different eyeglasses or stronger lighting. If that does not help your vision, surgery is the only effective treatment. A cataract needs to be surgically removed when vision loss interferes with your everyday activities, such as driving, reading, or watching TV. A cataract may also have to be removed if it prevents examination or treatment of another eye problem. Surgery removes the cloudy lens and usually replaces it with a substitute lens (intraocular lens, IOL).  At a time when both you and your doctor  agree, the cataract will be surgically removed. If you have cataracts in both eyes, only one is usually removed at a time. This allows the operated eye to heal and be out of danger from any possible problems after surgery (such as infection or poor wound healing). In rare cases, a cataract may be doing damage to your eye. In these cases, your caregiver may advise surgical removal right away. The vast majority of people who have cataract surgery have better vision afterward. HOME CARE INSTRUCTIONS  If you are not planning surgery, you may be asked to do the following:  Use different eyeglasses.  Use stronger or brighter lighting.  Ask your eye doctor about reducing your medicine dose or changing medicines if it is thought that a medicine caused your cataract. Changing medicines does not make the cataract go away on its own.  Become familiar with your surroundings. Poor vision can lead to injury. Avoid bumping into things on the affected side. You are at a higher risk for tripping or falling.  Exercise extreme care when driving or  operating machinery.  Wear sunglasses if you are sensitive to bright light or experiencing problems with glare. SEEK IMMEDIATE MEDICAL CARE IF:   You have a worsening or sudden vision loss.  You notice redness, swelling, or increasing pain in the eye.  You have a fever.   This information is not intended to replace advice given to you by your health care provider. Make sure you discuss any questions you have with your health care provider.   Document Released: 05/18/2005 Document Revised: 08/10/2011 Document Reviewed: 11/21/2014 Elsevier Interactive Patient Education 2016 [place] [facility], Care After Refer to this sheet in the next few weeks. These instructions provide you with information on caring for yourself after your procedure. Your caregiver may also give you more specific instructions. Your treatment has been planned according to current medical practices, but problems sometimes occur. Call your caregiver if you have any problems or questions after your procedure.  HOME CARE INSTRUCTIONS   Avoid strenuous activities as directed by your caregiver.  Ask your caregiver when you can resume driving.  Use eyedrops or other medicines to help healing and control pressure inside your eye as directed by your caregiver.  Only take over-the-counter or prescription medicines for pain, discomfort, or fever as directed by your caregiver.  Do not to touch or rub your eyes.  You may be instructed to use a protective shield during the first few days and nights after surgery. If not, wear sunglasses to protect your eyes. This is to protect the eye from pressure or from being accidentally bumped.  Keep the area around your eye clean and dry. Avoid swimming or allowing water to hit you directly in the face while showering. Keep soap and shampoo out of your eyes.  Do not bend or lift heavy objects. Bending increases pressure in the eye. You can walk, climb stairs, and do  light household chores.  Do not put a contact lens into the eye that had surgery until your caregiver says it is okay to do so.  Ask your doctor when you can return to work. This will depend on the kind of work that you do. If you work in a dusty environment, you may be advised to wear protective eyewear for a period of time.  Ask your caregiver when it will be safe to engage in sexual activity.  Continue with your  regular eye exams as directed by your caregiver. What to expect:  It is normal to feel itching and mild discomfort for a few days after cataract surgery. Some fluid discharge is also common, and your eye may be sensitive to light and touch.  After 1 to 2 days, even moderate discomfort should disappear. In most cases, healing will take about 6 weeks.  If you received an intraocular lens (IOL), you may notice that colors are very bright or have a blue tinge. Also, if you have been in bright sunlight, everything may appear reddish for a few hours. If you see these color tinges, it is because your lens is clear and no longer cloudy. Within a few months after receiving an IOL, these extra colors should go away. When you have healed, you will probably need new glasses. SEEK MEDICAL CARE IF:   You have increased bruising around your eye.  You have discomfort not helped by medicine. SEEK IMMEDIATE MEDICAL CARE IF:   You have a fever.  You have a worsening or sudden vision loss.  You have redness, swelling, or increasing pain in the eye.  You have a thick discharge from the eye that had surgery. MAKE SURE YOU:  Understand these instructions.  Will watch your condition.  Will get help right away if you are not doing well or get worse.   This information is not intended to replace advice given to you by your health care provider. Make sure you discuss any questions you have with your health care provider.   Document Released: 12/05/2004 Document Revised: 06/08/2014 Document  Reviewed: 01/09/2011 Elsevier Interactive Patient Education 2016 [place].   Anesthesia, Adult, Care After Refer to this sheet in the next few weeks. These instructions provide you with information on caring for yourself after your procedure. Your health care provider may also give you more specific instructions. Your treatment has been planned according to current medical practices, but problems sometimes occur. Call your health care provider if you have any problems or questions after your procedure. WHAT TO EXPECT AFTER THE PROCEDURE After the procedure, it is typical to experience:  Sleepiness.  Nausea and vomiting. HOME CARE INSTRUCTIONS  For the first 24 hours after general anesthesia:  Have a responsible person with you.  Do not drive a car. If you are alone, do not take public transportation.  Do not drink alcohol.  Do not take medicine that has not been prescribed by your health care provider.  Do not sign important papers or make important decisions.  You may resume a normal diet and activities as directed by your health care provider.  Change bandages (dressings) as directed.  If you have questions or problems that seem related to general anesthesia, call the hospital and ask for the anesthetist or anesthesiologist on call. SEEK MEDICAL CARE IF:  You have nausea and vomiting that continue the day after anesthesia.  You develop a rash. SEEK IMMEDIATE MEDICAL CARE IF:   You have difficulty breathing.  You have chest pain.  You have any allergic problems.   This information is not intended to replace advice given to you by your health care provider. Make sure you discuss any questions you have with your health care provider.   Document Released: 08/24/2000 Document Revised: 06/08/2014 Document Reviewed: 09/16/2011 Elsevier Interactive Patient Education [employer].

## 2015-06-24 ENCOUNTER — Encounter (HOSPITAL_COMMUNITY): Payer: Self-pay

## 2015-06-24 ENCOUNTER — Encounter (HOSPITAL_COMMUNITY)
Admission: RE | Admit: 2015-06-24 | Discharge: 2015-06-24 | Disposition: A | Discharge status: Home / Self Care | Payer: Medicare Other | Source: Ambulatory Visit | Attending: Ophthalmology | Admitting: Ophthalmology

## 2015-06-24 DIAGNOSIS — E079 Disorder of thyroid, unspecified: Secondary | ICD-10-CM | POA: Diagnosis not present

## 2015-06-24 DIAGNOSIS — Z01810 Encounter for preprocedural cardiovascular examination: Secondary | ICD-10-CM | POA: Diagnosis not present

## 2015-06-24 DIAGNOSIS — Z01812 Encounter for preprocedural laboratory examination: Secondary | ICD-10-CM | POA: Diagnosis not present

## 2015-06-24 DIAGNOSIS — Z79899 Other long term (current) drug therapy: Secondary | ICD-10-CM | POA: Diagnosis not present

## 2015-06-24 DIAGNOSIS — Z6830 Body mass index (BMI) 30.0-30.9, adult: Secondary | ICD-10-CM | POA: Diagnosis not present

## 2015-06-24 DIAGNOSIS — H2512 Age-related nuclear cataract, left eye: Secondary | ICD-10-CM | POA: Diagnosis not present

## 2015-06-24 DIAGNOSIS — Z87891 Personal history of nicotine dependence: Secondary | ICD-10-CM | POA: Diagnosis not present

## 2015-06-24 DIAGNOSIS — I10 Essential (primary) hypertension: Secondary | ICD-10-CM | POA: Diagnosis not present

## 2015-06-24 DIAGNOSIS — Z85820 Personal history of malignant melanoma of skin: Secondary | ICD-10-CM | POA: Diagnosis not present

## 2015-06-24 LAB — CBC
HEMATOCRIT: 40.9 % (ref 36.0–46.0)
Hemoglobin: 13.3 g/dL (ref 12.0–15.0)
MCH: 30 pg (ref 26.0–34.0)
MCHC: 32.5 g/dL (ref 30.0–36.0)
MCV: 92.1 fL (ref 78.0–100.0)
Platelets: 204 10*3/uL (ref 150–400)
RBC: 4.44 MIL/uL (ref 3.87–5.11)
RDW: 12.7 % (ref 11.5–15.5)
WBC: 5.2 10*3/uL (ref 4.0–10.5)

## 2015-06-24 LAB — BASIC METABOLIC PANEL
ANION GAP: 7 (ref 5–15)
BUN: 16 mg/dL (ref 6–20)
CALCIUM: 9.6 mg/dL (ref 8.9–10.3)
CHLORIDE: 106 mmol/L (ref 101–111)
CO2: 27 mmol/L (ref 22–32)
Creatinine, Ser: 0.72 mg/dL (ref 0.44–1.00)
GFR calc non Af Amer: >60 mL/min (ref >60)
GLUCOSE: 119 mg/dL — AB (ref 65–99)
Potassium: 4.5 mmol/L (ref 3.5–5.1)
Sodium: 140 mmol/L (ref 135–145)

## 2015-06-26 MED ORDER — NEOMYCIN-POLYMYXIN-DEXAMETH 3.5-10000-0.1 OP SUSP
OPHTHALMIC | Status: AC
  Filled 2015-06-26: qty 5

## 2015-06-26 MED ORDER — LIDOCAINE HCL (PF) 1 % IJ SOLN
INTRAMUSCULAR | Status: AC
  Filled 2015-06-26: qty 2

## 2015-06-26 MED ORDER — LIDOCAINE HCL 3.5 % OP GEL
OPHTHALMIC | Status: AC
  Filled 2015-06-26: qty 1

## 2015-06-26 MED ORDER — TETRACAINE HCL 0.5 % OP SOLN
OPHTHALMIC | Status: AC
  Filled 2015-06-26: qty 4

## 2015-06-26 MED ORDER — CYCLOPENTOLATE-PHENYLEPHRINE OP SOLN OPTIME - NO CHARGE
OPHTHALMIC | Status: AC
  Filled 2015-06-26: qty 2

## 2015-06-26 MED ORDER — PHENYLEPHRINE HCL 2.5 % OP SOLN
OPHTHALMIC | Status: AC
  Filled 2015-06-26: qty 15

## 2015-06-27 ENCOUNTER — Encounter (HOSPITAL_COMMUNITY): Payer: Self-pay | Admitting: *Deleted

## 2015-06-27 ENCOUNTER — Ambulatory Visit (HOSPITAL_COMMUNITY): Payer: Medicare Other | Admitting: Anesthesiology

## 2015-06-27 ENCOUNTER — Encounter (HOSPITAL_COMMUNITY)
Admission: RE | Disposition: A | Discharge status: Home / Self Care | Payer: Self-pay | Source: Ambulatory Visit | Attending: Ophthalmology

## 2015-06-27 ENCOUNTER — Ambulatory Visit (HOSPITAL_COMMUNITY)
Admission: RE | Admit: 2015-06-27 | Discharge: 2015-06-27 | Disposition: A | Discharge status: Home / Self Care | Payer: Medicare Other | Source: Ambulatory Visit | Attending: Ophthalmology | Admitting: Ophthalmology

## 2015-06-27 DIAGNOSIS — I10 Essential (primary) hypertension: Secondary | ICD-10-CM | POA: Diagnosis not present

## 2015-06-27 DIAGNOSIS — E079 Disorder of thyroid, unspecified: Secondary | ICD-10-CM | POA: Diagnosis not present

## 2015-06-27 DIAGNOSIS — Z01810 Encounter for preprocedural cardiovascular examination: Secondary | ICD-10-CM | POA: Diagnosis not present

## 2015-06-27 DIAGNOSIS — H2512 Age-related nuclear cataract, left eye: Secondary | ICD-10-CM | POA: Diagnosis not present

## 2015-06-27 DIAGNOSIS — Z79899 Other long term (current) drug therapy: Secondary | ICD-10-CM | POA: Insufficient documentation

## 2015-06-27 DIAGNOSIS — Z01812 Encounter for preprocedural laboratory examination: Secondary | ICD-10-CM | POA: Insufficient documentation

## 2015-06-27 DIAGNOSIS — Z85820 Personal history of malignant melanoma of skin: Secondary | ICD-10-CM | POA: Insufficient documentation

## 2015-06-27 DIAGNOSIS — H269 Unspecified cataract: Secondary | ICD-10-CM | POA: Diagnosis not present

## 2015-06-27 DIAGNOSIS — Z6830 Body mass index (BMI) 30.0-30.9, adult: Secondary | ICD-10-CM | POA: Insufficient documentation

## 2015-06-27 DIAGNOSIS — Z87891 Personal history of nicotine dependence: Secondary | ICD-10-CM | POA: Insufficient documentation

## 2015-06-27 HISTORY — PX: CATARACT EXTRACTION W/PHACO: SHX586

## 2015-06-27 SURGERY — PHACOEMULSIFICATION, CATARACT, WITH IOL INSERTION
Anesthesia: Monitor Anesthesia Care | Site: Eye | Laterality: Left

## 2015-06-27 MED ORDER — NEOMYCIN-POLYMYXIN-DEXAMETH 3.5-10000-0.1 OP SUSP
OPHTHALMIC | Status: DC | PRN
  Administered 2015-06-27: 2 [drp] via OPHTHALMIC

## 2015-06-27 MED ORDER — LIDOCAINE HCL (PF) 1 % IJ SOLN
INTRAMUSCULAR | Status: DC | PRN
  Administered 2015-06-27: .5 mL

## 2015-06-27 MED ORDER — MIDAZOLAM HCL 2 MG/2ML IJ SOLN
1.0000 mg | INTRAMUSCULAR | Status: DC | PRN
  Administered 2015-06-27: 2 mg via INTRAVENOUS

## 2015-06-27 MED ORDER — LIDOCAINE HCL 3.5 % OP GEL: 1.0000 "application " | Freq: Once | OPHTHALMIC | Status: DC

## 2015-06-27 MED ORDER — EPINEPHRINE HCL 1 MG/ML IJ SOLN
INTRAOCULAR | Status: DC | PRN
  Administered 2015-06-27: 500 mL

## 2015-06-27 MED ORDER — POVIDONE-IODINE 5 % OP SOLN
OPHTHALMIC | Status: DC | PRN
  Administered 2015-06-27: 1 via OPHTHALMIC

## 2015-06-27 MED ORDER — MIDAZOLAM HCL 2 MG/2ML IJ SOLN
INTRAMUSCULAR | Status: AC
  Filled 2015-06-27: qty 2

## 2015-06-27 MED ORDER — PROVISC 10 MG/ML IO SOLN
INTRAOCULAR | Status: DC | PRN
  Administered 2015-06-27: 0.85 mL via INTRAOCULAR

## 2015-06-27 MED ORDER — EPINEPHRINE HCL 1 MG/ML IJ SOLN
INTRAMUSCULAR | Status: AC
  Filled 2015-06-27: qty 1

## 2015-06-27 MED ORDER — TETRACAINE HCL 0.5 % OP SOLN
1.0000 [drp] | OPHTHALMIC | Status: AC
  Administered 2015-06-27 (×3): 1 [drp] via OPHTHALMIC

## 2015-06-27 MED ORDER — CYCLOPENTOLATE-PHENYLEPHRINE 0.2-1 % OP SOLN
1.0000 [drp] | OPHTHALMIC | Status: AC
  Administered 2015-06-27 (×3): 1 [drp] via OPHTHALMIC

## 2015-06-27 MED ORDER — PHENYLEPHRINE HCL 2.5 % OP SOLN
1.0000 [drp] | OPHTHALMIC | Status: AC
  Administered 2015-06-27 (×3): 1 [drp] via OPHTHALMIC

## 2015-06-27 MED ORDER — BSS IO SOLN
INTRAOCULAR | Status: DC | PRN
  Administered 2015-06-27: 15 mL

## 2015-06-27 MED ORDER — LACTATED RINGERS IV SOLN
INTRAVENOUS | Status: DC
  Administered 2015-06-27: 07:00:00 via INTRAVENOUS

## 2015-06-27 SURGICAL SUPPLY — 34 items
CAPSULAR TENSION RING-AMO (OPHTHALMIC RELATED) IMPLANT
CLOTH BEACON ORANGE TIMEOUT ST (SAFETY) ×3 IMPLANT
EYE SHIELD UNIVERSAL CLEAR (GAUZE/BANDAGES/DRESSINGS) ×3 IMPLANT
GLOVE BIO SURGEON STRL SZ 6.5 (GLOVE) IMPLANT
GLOVE BIO SURGEONS STRL SZ 6.5 (GLOVE)
GLOVE BIOGEL PI IND STRL 6.5 (GLOVE) IMPLANT
GLOVE BIOGEL PI IND STRL 7.0 (GLOVE) ×1 IMPLANT
GLOVE BIOGEL PI IND STRL 7.5 (GLOVE) ×1 IMPLANT
GLOVE BIOGEL PI INDICATOR 6.5 (GLOVE)
GLOVE BIOGEL PI INDICATOR 7.0 (GLOVE) ×2
GLOVE BIOGEL PI INDICATOR 7.5 (GLOVE) ×2
GLOVE ECLIPSE 6.5 STRL STRAW (GLOVE) IMPLANT
GLOVE ECLIPSE 7.0 STRL STRAW (GLOVE) IMPLANT
GLOVE ECLIPSE 7.5 STRL STRAW (GLOVE) IMPLANT
GLOVE EXAM NITRILE LRG STRL (GLOVE) IMPLANT
GLOVE EXAM NITRILE MD LF STRL (GLOVE) IMPLANT
GLOVE SKINSENSE NS SZ6.5 (GLOVE)
GLOVE SKINSENSE NS SZ7.0 (GLOVE)
GLOVE SKINSENSE STRL SZ6.5 (GLOVE) IMPLANT
GLOVE SKINSENSE STRL SZ7.0 (GLOVE) IMPLANT
KIT VITRECTOMY (OPHTHALMIC RELATED) IMPLANT
PAD ARMBOARD 7.5X6 YLW CONV (MISCELLANEOUS) ×3 IMPLANT
PROC W NO LENS (INTRAOCULAR LENS)
PROC W SPEC LENS (INTRAOCULAR LENS)
PROCESS W NO LENS (INTRAOCULAR LENS) IMPLANT
PROCESS W SPEC LENS (INTRAOCULAR LENS) IMPLANT
RETRACTOR IRIS SIGHTPATH (OPHTHALMIC RELATED) IMPLANT
RING MALYGIN (MISCELLANEOUS) IMPLANT
SIGHTPATH CAT PROC W REG LENS (Ophthalmic Related) ×3 IMPLANT
SYRINGE LUER LOK 1CC (MISCELLANEOUS) ×3 IMPLANT
TAPE SURG TRANSPORE 1 IN (GAUZE/BANDAGES/DRESSINGS) ×1 IMPLANT
TAPE SURGICAL TRANSPORE 1 IN (GAUZE/BANDAGES/DRESSINGS) ×2
VISCOELASTIC ADDITIONAL (OPHTHALMIC RELATED) IMPLANT
WATER STERILE IRR 250ML POUR (IV SOLUTION) ×3 IMPLANT

## 2015-06-27 NOTE — Transfer of Care (Signed)
Immediate Anesthesia Transfer of Care Note  Patient: Kaylee Gilbert  Procedure(s) Performed: Procedure(s) with comments: CATARACT EXTRACTION PHACO AND INTRAOCULAR LENS PLACEMENT LEFT EYE (Left) - CDE 8.27  Patient Location: Short Stay  Anesthesia Type:MAC  Level of Consciousness: awake  Airway & Oxygen Therapy: Patient Spontanous Breathing  Post-op Assessment: Report given to RN  Post vital signs: Reviewed  Last Vitals:  Filed Vitals:   06/27/15 0633  BP: 160/70  Pulse: 73  Temp: 36.5 C  Resp: 18    Complications: No apparent anesthesia complications

## 2015-06-27 NOTE — Op Note (Signed)
Date of Admission: 06/27/2015  Date of Surgery: 06/27/2015   Pre-Op Dx: Cataract Left Eye  Post-Op Dx: Senile nuclear Cataract Left  Eye,  Dx Code H25.12  Surgeon: Tonny Branch, M.D.  Assistants: None  Anesthesia: Topical with MAC  Indications: Painless, progressive loss of vision with compromise of daily activities.  Surgery: Cataract Extraction with Intraocular lens Implant Left Eye  Discription: The patient had dilating drops and viscous lidocaine placed into the Left eye in the pre-op holding area. After transfer to the operating room, a time out was performed. The patient was then prepped and draped. Beginning with a 77 degree blade a paracentesis port was made at the surgeon's 2 o'clock position. The anterior chamber was then filled with 1% non-preserved lidocaine. This was followed by filling the anterior chamber with Provisc.  A 2.17mm keratome blade was used to make a clear corneal incision at the temporal limbus.  A bent cystatome needle was used to create a continuous tear capsulotomy. Hydrodissection was performed with balanced salt solution on a Fine canula. The lens nucleus was then removed using the phacoemulsification handpiece. Residual cortex was removed with the I&A handpiece. The anterior chamber and capsular bag were refilled with Provisc. A posterior chamber intraocular lens was placed into the capsular bag with it's injector. The implant was positioned with the Kuglan hook. The Provisc was then removed from the anterior chamber and capsular bag with the I&A handpiece. Stromal hydration of the main incision and paracentesis port was performed with BSS on a Fine canula. The wounds were tested for leak which was negative. The patient tolerated the procedure well. There were no operative complications. The patient was then transferred to the recovery room in stable condition.  Complications: None  Specimen: None  EBL: None  Prosthetic device: Hoya iSert 250, power 21.5 D, SN  [id number].

## 2015-06-27 NOTE — Anesthesia Postprocedure Evaluation (Signed)
Anesthesia Post Note  Patient: CHRYSTEL DEGEUS  Procedure(s) Performed: Procedure(s) (LRB): CATARACT EXTRACTION PHACO AND INTRAOCULAR LENS PLACEMENT LEFT EYE (Left)  Patient location during evaluation: Short Stay Anesthesia Type: MAC Level of consciousness: awake and alert Pain management: pain level controlled Vital Signs Assessment: post-procedure vital signs reviewed and stable Respiratory status: spontaneous breathing Cardiovascular status: blood pressure returned to baseline Postop Assessment: no signs of nausea or vomiting Anesthetic complications: no    Last Vitals:  Filed Vitals:   06/27/15 0633  BP: 160/70  Pulse: 73  Temp: 36.5 C  Resp: 18    Last Pain: There were no vitals filed for this visit.               Velecia Ovitt

## 2015-06-27 NOTE — H&P (Signed)
I have reviewed the H&P, the patient was re-examined, and I have identified no interval changes in medical condition and plan of care since the history and physical of record  

## 2015-06-27 NOTE — Anesthesia Preprocedure Evaluation (Signed)
Anesthesia Evaluation  Patient identified by MRN, date of birth, ID band  Reviewed: Allergy & Precautions, Patient's Chart, lab work & pertinent test results  Airway Mallampati: II  TM Distance: <3 FB Neck ROM: Full    Dental  (+) Teeth Intact, Edentulous Upper, Caps, Dental Advisory Given,    Pulmonary former smoker,    Pulmonary exam normal        Cardiovascular Normal cardiovascular exam     Neuro/Psych    GI/Hepatic   Endo/Other  Hypothyroidism   Renal/GU      Musculoskeletal   Abdominal (+) + obese,   Peds  Hematology   Anesthesia Other Findings   Reproductive/Obstetrics                             Anesthesia Physical Anesthesia Plan  ASA: III  Anesthesia Plan: MAC   Post-op Pain Management:    Induction: Intravenous  Airway Management Planned: Nasal Cannula  Additional Equipment:   Intra-op Plan:   Post-operative Plan:   Informed Consent: I have reviewed the patients History and Physical, chart, labs and discussed the procedure including the risks, benefits and alternatives for the proposed anesthesia with the patient or authorized representative who has indicated his/her understanding and acceptance.   Dental advisory given  Plan Discussed with: CRNA  Anesthesia Plan Comments:         Anesthesia Quick Evaluation

## 2015-06-27 NOTE — Discharge Instructions (Signed)

## 2015-06-28 ENCOUNTER — Encounter (HOSPITAL_COMMUNITY): Payer: Self-pay | Admitting: Ophthalmology

## 2015-08-05 DIAGNOSIS — J301 Allergic rhinitis due to pollen: Secondary | ICD-10-CM | POA: Diagnosis not present

## 2015-08-05 DIAGNOSIS — E782 Mixed hyperlipidemia: Secondary | ICD-10-CM | POA: Diagnosis not present

## 2015-08-05 DIAGNOSIS — E039 Hypothyroidism, unspecified: Secondary | ICD-10-CM | POA: Diagnosis not present

## 2016-01-27 ENCOUNTER — Other Ambulatory Visit: Payer: Self-pay

## 2016-02-27 DIAGNOSIS — E782 Mixed hyperlipidemia: Secondary | ICD-10-CM | POA: Diagnosis not present

## 2016-02-27 DIAGNOSIS — Z9189 Other specified personal risk factors, not elsewhere classified: Secondary | ICD-10-CM | POA: Diagnosis not present

## 2016-02-27 DIAGNOSIS — E6609 Other obesity due to excess calories: Secondary | ICD-10-CM | POA: Diagnosis not present

## 2016-02-27 DIAGNOSIS — E039 Hypothyroidism, unspecified: Secondary | ICD-10-CM | POA: Diagnosis not present

## 2016-03-02 DIAGNOSIS — E6609 Other obesity due to excess calories: Secondary | ICD-10-CM | POA: Diagnosis not present

## 2016-03-02 DIAGNOSIS — E782 Mixed hyperlipidemia: Secondary | ICD-10-CM | POA: Diagnosis not present

## 2016-03-02 DIAGNOSIS — Z6830 Body mass index (BMI) 30.0-30.9, adult: Secondary | ICD-10-CM | POA: Diagnosis not present

## 2016-03-02 DIAGNOSIS — J301 Allergic rhinitis due to pollen: Secondary | ICD-10-CM | POA: Diagnosis not present

## 2016-03-02 DIAGNOSIS — Z23 Encounter for immunization: Secondary | ICD-10-CM | POA: Diagnosis not present

## 2016-03-02 DIAGNOSIS — Z1212 Encounter for screening for malignant neoplasm of rectum: Secondary | ICD-10-CM | POA: Diagnosis not present

## 2016-03-02 DIAGNOSIS — Z0001 Encounter for general adult medical examination with abnormal findings: Secondary | ICD-10-CM | POA: Diagnosis not present

## 2016-03-02 DIAGNOSIS — E039 Hypothyroidism, unspecified: Secondary | ICD-10-CM | POA: Diagnosis not present

## 2016-04-15 DIAGNOSIS — L821 Other seborrheic keratosis: Secondary | ICD-10-CM | POA: Diagnosis not present

## 2016-04-15 DIAGNOSIS — D1800 Hemangioma unspecified site: Secondary | ICD-10-CM | POA: Diagnosis not present

## 2016-04-15 DIAGNOSIS — L570 Actinic keratosis: Secondary | ICD-10-CM | POA: Diagnosis not present

## 2016-05-18 DIAGNOSIS — M85852 Other specified disorders of bone density and structure, left thigh: Secondary | ICD-10-CM | POA: Diagnosis not present

## 2016-05-18 DIAGNOSIS — Z79899 Other long term (current) drug therapy: Secondary | ICD-10-CM | POA: Diagnosis not present

## 2016-05-18 DIAGNOSIS — E039 Hypothyroidism, unspecified: Secondary | ICD-10-CM | POA: Diagnosis not present

## 2016-05-18 DIAGNOSIS — M810 Age-related osteoporosis without current pathological fracture: Secondary | ICD-10-CM | POA: Diagnosis not present

## 2016-05-18 DIAGNOSIS — Z780 Asymptomatic menopausal state: Secondary | ICD-10-CM | POA: Diagnosis not present

## 2016-08-31 DIAGNOSIS — E782 Mixed hyperlipidemia: Secondary | ICD-10-CM | POA: Diagnosis not present

## 2016-08-31 DIAGNOSIS — E039 Hypothyroidism, unspecified: Secondary | ICD-10-CM | POA: Diagnosis not present

## 2016-09-03 DIAGNOSIS — Z1212 Encounter for screening for malignant neoplasm of rectum: Secondary | ICD-10-CM | POA: Diagnosis not present

## 2016-09-03 DIAGNOSIS — E782 Mixed hyperlipidemia: Secondary | ICD-10-CM | POA: Diagnosis not present

## 2016-09-03 DIAGNOSIS — Z23 Encounter for immunization: Secondary | ICD-10-CM | POA: Diagnosis not present

## 2016-09-03 DIAGNOSIS — E6609 Other obesity due to excess calories: Secondary | ICD-10-CM | POA: Diagnosis not present

## 2016-09-03 DIAGNOSIS — E039 Hypothyroidism, unspecified: Secondary | ICD-10-CM | POA: Diagnosis not present

## 2016-09-03 DIAGNOSIS — J301 Allergic rhinitis due to pollen: Secondary | ICD-10-CM | POA: Diagnosis not present

## 2016-09-17 DIAGNOSIS — Z1231 Encounter for screening mammogram for malignant neoplasm of breast: Secondary | ICD-10-CM | POA: Diagnosis not present

## 2016-10-20 DIAGNOSIS — J309 Allergic rhinitis, unspecified: Secondary | ICD-10-CM | POA: Diagnosis not present

## 2016-10-20 DIAGNOSIS — Z90710 Acquired absence of both cervix and uterus: Secondary | ICD-10-CM | POA: Diagnosis not present

## 2016-10-20 DIAGNOSIS — R112 Nausea with vomiting, unspecified: Secondary | ICD-10-CM | POA: Diagnosis not present

## 2016-10-20 DIAGNOSIS — E876 Hypokalemia: Secondary | ICD-10-CM | POA: Diagnosis not present

## 2016-10-20 DIAGNOSIS — Z800 Family history of malignant neoplasm of digestive organs: Secondary | ICD-10-CM | POA: Diagnosis not present

## 2016-10-20 DIAGNOSIS — Z9049 Acquired absence of other specified parts of digestive tract: Secondary | ICD-10-CM | POA: Diagnosis not present

## 2016-10-20 DIAGNOSIS — K219 Gastro-esophageal reflux disease without esophagitis: Secondary | ICD-10-CM | POA: Diagnosis not present

## 2016-10-20 DIAGNOSIS — Z818 Family history of other mental and behavioral disorders: Secondary | ICD-10-CM | POA: Diagnosis not present

## 2016-10-20 DIAGNOSIS — R030 Elevated blood-pressure reading, without diagnosis of hypertension: Secondary | ICD-10-CM | POA: Diagnosis not present

## 2016-10-20 DIAGNOSIS — R42 Dizziness and giddiness: Secondary | ICD-10-CM | POA: Diagnosis not present

## 2016-10-20 DIAGNOSIS — R739 Hyperglycemia, unspecified: Secondary | ICD-10-CM | POA: Diagnosis not present

## 2016-10-20 DIAGNOSIS — R262 Difficulty in walking, not elsewhere classified: Secondary | ICD-10-CM | POA: Diagnosis not present

## 2016-10-20 DIAGNOSIS — I10 Essential (primary) hypertension: Secondary | ICD-10-CM | POA: Diagnosis not present

## 2016-10-20 DIAGNOSIS — Z8249 Family history of ischemic heart disease and other diseases of the circulatory system: Secondary | ICD-10-CM | POA: Diagnosis not present

## 2016-10-20 DIAGNOSIS — R404 Transient alteration of awareness: Secondary | ICD-10-CM | POA: Diagnosis not present

## 2016-10-20 DIAGNOSIS — E039 Hypothyroidism, unspecified: Secondary | ICD-10-CM | POA: Diagnosis not present

## 2016-10-20 DIAGNOSIS — Z79899 Other long term (current) drug therapy: Secondary | ICD-10-CM | POA: Diagnosis not present

## 2016-10-20 DIAGNOSIS — Z86010 Personal history of colonic polyps: Secondary | ICD-10-CM | POA: Diagnosis not present

## 2016-10-21 DIAGNOSIS — H8143 Vertigo of central origin, bilateral: Secondary | ICD-10-CM | POA: Diagnosis not present

## 2016-11-18 DIAGNOSIS — I10 Essential (primary) hypertension: Secondary | ICD-10-CM | POA: Diagnosis not present

## 2016-11-20 DIAGNOSIS — I10 Essential (primary) hypertension: Secondary | ICD-10-CM | POA: Diagnosis not present

## 2016-11-20 DIAGNOSIS — H8113 Benign paroxysmal vertigo, bilateral: Secondary | ICD-10-CM | POA: Diagnosis not present

## 2016-11-20 DIAGNOSIS — Z6830 Body mass index (BMI) 30.0-30.9, adult: Secondary | ICD-10-CM | POA: Diagnosis not present

## 2017-01-29 DIAGNOSIS — H524 Presbyopia: Secondary | ICD-10-CM | POA: Diagnosis not present

## 2017-01-29 DIAGNOSIS — H52223 Regular astigmatism, bilateral: Secondary | ICD-10-CM | POA: Diagnosis not present

## 2017-01-29 DIAGNOSIS — H5203 Hypermetropia, bilateral: Secondary | ICD-10-CM | POA: Diagnosis not present

## 2017-01-29 DIAGNOSIS — H40012 Open angle with borderline findings, low risk, left eye: Secondary | ICD-10-CM | POA: Diagnosis not present

## 2017-03-05 DIAGNOSIS — Z23 Encounter for immunization: Secondary | ICD-10-CM | POA: Diagnosis not present

## 2017-03-22 DIAGNOSIS — E038 Other specified hypothyroidism: Secondary | ICD-10-CM | POA: Diagnosis not present

## 2017-03-22 DIAGNOSIS — Z9189 Other specified personal risk factors, not elsewhere classified: Secondary | ICD-10-CM | POA: Diagnosis not present

## 2017-03-22 DIAGNOSIS — I10 Essential (primary) hypertension: Secondary | ICD-10-CM | POA: Diagnosis not present

## 2017-03-22 DIAGNOSIS — E782 Mixed hyperlipidemia: Secondary | ICD-10-CM | POA: Diagnosis not present

## 2017-03-22 DIAGNOSIS — E039 Hypothyroidism, unspecified: Secondary | ICD-10-CM | POA: Diagnosis not present

## 2017-03-24 DIAGNOSIS — E6609 Other obesity due to excess calories: Secondary | ICD-10-CM | POA: Diagnosis not present

## 2017-03-24 DIAGNOSIS — Z0001 Encounter for general adult medical examination with abnormal findings: Secondary | ICD-10-CM | POA: Diagnosis not present

## 2017-03-24 DIAGNOSIS — Z1212 Encounter for screening for malignant neoplasm of rectum: Secondary | ICD-10-CM | POA: Diagnosis not present

## 2017-03-24 DIAGNOSIS — J301 Allergic rhinitis due to pollen: Secondary | ICD-10-CM | POA: Diagnosis not present

## 2017-03-24 DIAGNOSIS — E782 Mixed hyperlipidemia: Secondary | ICD-10-CM | POA: Diagnosis not present

## 2017-03-24 DIAGNOSIS — E039 Hypothyroidism, unspecified: Secondary | ICD-10-CM | POA: Diagnosis not present

## 2017-03-24 DIAGNOSIS — Z6831 Body mass index (BMI) 31.0-31.9, adult: Secondary | ICD-10-CM | POA: Diagnosis not present

## 2017-03-25 DIAGNOSIS — H26492 Other secondary cataract, left eye: Secondary | ICD-10-CM | POA: Diagnosis not present

## 2017-03-25 DIAGNOSIS — H26491 Other secondary cataract, right eye: Secondary | ICD-10-CM | POA: Diagnosis not present

## 2017-03-25 DIAGNOSIS — D2310 Other benign neoplasm of skin of unspecified eyelid, including canthus: Secondary | ICD-10-CM | POA: Diagnosis not present

## 2017-04-08 DIAGNOSIS — D369 Benign neoplasm, unspecified site: Secondary | ICD-10-CM | POA: Diagnosis not present

## 2017-04-08 DIAGNOSIS — D23111 Other benign neoplasm of skin of right upper eyelid, including canthus: Secondary | ICD-10-CM | POA: Diagnosis not present

## 2017-04-08 DIAGNOSIS — L821 Other seborrheic keratosis: Secondary | ICD-10-CM | POA: Diagnosis not present

## 2017-04-14 DIAGNOSIS — L570 Actinic keratosis: Secondary | ICD-10-CM | POA: Diagnosis not present

## 2017-08-17 DIAGNOSIS — M25579 Pain in unspecified ankle and joints of unspecified foot: Secondary | ICD-10-CM | POA: Diagnosis not present

## 2017-08-17 DIAGNOSIS — M79672 Pain in left foot: Secondary | ICD-10-CM | POA: Diagnosis not present

## 2017-08-17 DIAGNOSIS — M79671 Pain in right foot: Secondary | ICD-10-CM | POA: Diagnosis not present

## 2017-09-15 DIAGNOSIS — E6609 Other obesity due to excess calories: Secondary | ICD-10-CM | POA: Diagnosis not present

## 2017-09-15 DIAGNOSIS — E782 Mixed hyperlipidemia: Secondary | ICD-10-CM | POA: Diagnosis not present

## 2017-09-15 DIAGNOSIS — Z1331 Encounter for screening for depression: Secondary | ICD-10-CM | POA: Diagnosis not present

## 2017-09-15 DIAGNOSIS — I10 Essential (primary) hypertension: Secondary | ICD-10-CM | POA: Diagnosis not present

## 2017-09-15 DIAGNOSIS — Z6831 Body mass index (BMI) 31.0-31.9, adult: Secondary | ICD-10-CM | POA: Diagnosis not present

## 2017-09-15 DIAGNOSIS — E039 Hypothyroidism, unspecified: Secondary | ICD-10-CM | POA: Diagnosis not present

## 2017-09-15 DIAGNOSIS — J301 Allergic rhinitis due to pollen: Secondary | ICD-10-CM | POA: Diagnosis not present

## 2017-09-15 DIAGNOSIS — Z1389 Encounter for screening for other disorder: Secondary | ICD-10-CM | POA: Diagnosis not present

## 2017-10-29 DIAGNOSIS — E782 Mixed hyperlipidemia: Secondary | ICD-10-CM | POA: Diagnosis not present

## 2017-10-29 DIAGNOSIS — E6609 Other obesity due to excess calories: Secondary | ICD-10-CM | POA: Diagnosis not present

## 2017-10-29 DIAGNOSIS — J301 Allergic rhinitis due to pollen: Secondary | ICD-10-CM | POA: Diagnosis not present

## 2017-10-29 DIAGNOSIS — Z6830 Body mass index (BMI) 30.0-30.9, adult: Secondary | ICD-10-CM | POA: Diagnosis not present

## 2017-10-29 DIAGNOSIS — F331 Major depressive disorder, recurrent, moderate: Secondary | ICD-10-CM | POA: Diagnosis not present

## 2017-10-29 DIAGNOSIS — I10 Essential (primary) hypertension: Secondary | ICD-10-CM | POA: Diagnosis not present

## 2017-10-29 DIAGNOSIS — E039 Hypothyroidism, unspecified: Secondary | ICD-10-CM | POA: Diagnosis not present

## 2017-11-19 DIAGNOSIS — I10 Essential (primary) hypertension: Secondary | ICD-10-CM | POA: Diagnosis not present

## 2017-11-19 DIAGNOSIS — G4733 Obstructive sleep apnea (adult) (pediatric): Secondary | ICD-10-CM | POA: Diagnosis not present

## 2017-11-22 DIAGNOSIS — I10 Essential (primary) hypertension: Secondary | ICD-10-CM | POA: Diagnosis not present

## 2017-11-22 DIAGNOSIS — G4733 Obstructive sleep apnea (adult) (pediatric): Secondary | ICD-10-CM | POA: Diagnosis not present

## 2018-02-24 DIAGNOSIS — Z23 Encounter for immunization: Secondary | ICD-10-CM | POA: Diagnosis not present

## 2018-02-24 DIAGNOSIS — M7051 Other bursitis of knee, right knee: Secondary | ICD-10-CM | POA: Diagnosis not present

## 2018-02-24 DIAGNOSIS — M1711 Unilateral primary osteoarthritis, right knee: Secondary | ICD-10-CM | POA: Diagnosis not present

## 2018-03-21 DIAGNOSIS — Z1231 Encounter for screening mammogram for malignant neoplasm of breast: Secondary | ICD-10-CM | POA: Diagnosis not present

## 2018-03-28 DIAGNOSIS — E038 Other specified hypothyroidism: Secondary | ICD-10-CM | POA: Diagnosis not present

## 2018-03-28 DIAGNOSIS — M79672 Pain in left foot: Secondary | ICD-10-CM | POA: Diagnosis not present

## 2018-03-28 DIAGNOSIS — I739 Peripheral vascular disease, unspecified: Secondary | ICD-10-CM | POA: Diagnosis not present

## 2018-03-28 DIAGNOSIS — I10 Essential (primary) hypertension: Secondary | ICD-10-CM | POA: Diagnosis not present

## 2018-03-28 DIAGNOSIS — M79671 Pain in right foot: Secondary | ICD-10-CM | POA: Diagnosis not present

## 2018-03-28 DIAGNOSIS — E782 Mixed hyperlipidemia: Secondary | ICD-10-CM | POA: Diagnosis not present

## 2018-03-31 DIAGNOSIS — Z0001 Encounter for general adult medical examination with abnormal findings: Secondary | ICD-10-CM | POA: Diagnosis not present

## 2018-03-31 DIAGNOSIS — Z1212 Encounter for screening for malignant neoplasm of rectum: Secondary | ICD-10-CM | POA: Diagnosis not present

## 2018-03-31 DIAGNOSIS — I10 Essential (primary) hypertension: Secondary | ICD-10-CM | POA: Diagnosis not present

## 2018-03-31 DIAGNOSIS — Z6830 Body mass index (BMI) 30.0-30.9, adult: Secondary | ICD-10-CM | POA: Diagnosis not present

## 2018-03-31 DIAGNOSIS — Z23 Encounter for immunization: Secondary | ICD-10-CM | POA: Diagnosis not present

## 2018-04-07 DIAGNOSIS — M7051 Other bursitis of knee, right knee: Secondary | ICD-10-CM | POA: Diagnosis not present

## 2018-04-07 DIAGNOSIS — M1711 Unilateral primary osteoarthritis, right knee: Secondary | ICD-10-CM | POA: Diagnosis not present

## 2018-04-13 DIAGNOSIS — L570 Actinic keratosis: Secondary | ICD-10-CM | POA: Diagnosis not present

## 2018-04-18 DIAGNOSIS — R262 Difficulty in walking, not elsewhere classified: Secondary | ICD-10-CM | POA: Diagnosis not present

## 2018-04-18 DIAGNOSIS — M7051 Other bursitis of knee, right knee: Secondary | ICD-10-CM | POA: Diagnosis not present

## 2018-04-18 DIAGNOSIS — M25561 Pain in right knee: Secondary | ICD-10-CM | POA: Diagnosis not present

## 2018-04-18 DIAGNOSIS — M1711 Unilateral primary osteoarthritis, right knee: Secondary | ICD-10-CM | POA: Diagnosis not present

## 2018-04-22 DIAGNOSIS — M1711 Unilateral primary osteoarthritis, right knee: Secondary | ICD-10-CM | POA: Diagnosis not present

## 2018-04-22 DIAGNOSIS — M25561 Pain in right knee: Secondary | ICD-10-CM | POA: Diagnosis not present

## 2018-04-22 DIAGNOSIS — M7051 Other bursitis of knee, right knee: Secondary | ICD-10-CM | POA: Diagnosis not present

## 2018-04-22 DIAGNOSIS — R262 Difficulty in walking, not elsewhere classified: Secondary | ICD-10-CM | POA: Diagnosis not present

## 2018-04-27 DIAGNOSIS — M25561 Pain in right knee: Secondary | ICD-10-CM | POA: Diagnosis not present

## 2018-04-27 DIAGNOSIS — M7051 Other bursitis of knee, right knee: Secondary | ICD-10-CM | POA: Diagnosis not present

## 2018-04-27 DIAGNOSIS — M1711 Unilateral primary osteoarthritis, right knee: Secondary | ICD-10-CM | POA: Diagnosis not present

## 2018-04-27 DIAGNOSIS — R262 Difficulty in walking, not elsewhere classified: Secondary | ICD-10-CM | POA: Diagnosis not present

## 2018-05-03 ENCOUNTER — Encounter (INDEPENDENT_AMBULATORY_CARE_PROVIDER_SITE_OTHER): Payer: Self-pay | Admitting: *Deleted

## 2018-05-04 DIAGNOSIS — M1711 Unilateral primary osteoarthritis, right knee: Secondary | ICD-10-CM | POA: Diagnosis not present

## 2018-05-04 DIAGNOSIS — M7051 Other bursitis of knee, right knee: Secondary | ICD-10-CM | POA: Diagnosis not present

## 2018-05-06 DIAGNOSIS — M1711 Unilateral primary osteoarthritis, right knee: Secondary | ICD-10-CM | POA: Diagnosis not present

## 2018-05-06 DIAGNOSIS — M7051 Other bursitis of knee, right knee: Secondary | ICD-10-CM | POA: Diagnosis not present

## 2018-05-11 DIAGNOSIS — M7051 Other bursitis of knee, right knee: Secondary | ICD-10-CM | POA: Diagnosis not present

## 2018-05-11 DIAGNOSIS — M1711 Unilateral primary osteoarthritis, right knee: Secondary | ICD-10-CM | POA: Diagnosis not present

## 2018-05-17 DIAGNOSIS — M1711 Unilateral primary osteoarthritis, right knee: Secondary | ICD-10-CM | POA: Diagnosis not present

## 2018-05-17 DIAGNOSIS — M7051 Other bursitis of knee, right knee: Secondary | ICD-10-CM | POA: Diagnosis not present

## 2018-05-18 DIAGNOSIS — Z6831 Body mass index (BMI) 31.0-31.9, adult: Secondary | ICD-10-CM | POA: Diagnosis not present

## 2018-05-18 DIAGNOSIS — M7051 Other bursitis of knee, right knee: Secondary | ICD-10-CM | POA: Diagnosis not present

## 2018-05-18 DIAGNOSIS — M1711 Unilateral primary osteoarthritis, right knee: Secondary | ICD-10-CM | POA: Diagnosis not present

## 2018-05-18 DIAGNOSIS — I10 Essential (primary) hypertension: Secondary | ICD-10-CM | POA: Diagnosis not present

## 2018-05-19 DIAGNOSIS — M7051 Other bursitis of knee, right knee: Secondary | ICD-10-CM | POA: Diagnosis not present

## 2018-06-20 DIAGNOSIS — H40013 Open angle with borderline findings, low risk, bilateral: Secondary | ICD-10-CM | POA: Diagnosis not present

## 2018-06-20 DIAGNOSIS — H40003 Preglaucoma, unspecified, bilateral: Secondary | ICD-10-CM | POA: Diagnosis not present

## 2018-08-12 DIAGNOSIS — Z6831 Body mass index (BMI) 31.0-31.9, adult: Secondary | ICD-10-CM | POA: Diagnosis not present

## 2018-08-12 DIAGNOSIS — J209 Acute bronchitis, unspecified: Secondary | ICD-10-CM | POA: Diagnosis not present

## 2018-09-28 DIAGNOSIS — F331 Major depressive disorder, recurrent, moderate: Secondary | ICD-10-CM | POA: Diagnosis not present

## 2018-09-28 DIAGNOSIS — I10 Essential (primary) hypertension: Secondary | ICD-10-CM | POA: Diagnosis not present

## 2018-09-28 DIAGNOSIS — E039 Hypothyroidism, unspecified: Secondary | ICD-10-CM | POA: Diagnosis not present

## 2018-09-28 DIAGNOSIS — Z6831 Body mass index (BMI) 31.0-31.9, adult: Secondary | ICD-10-CM | POA: Diagnosis not present

## 2018-09-28 DIAGNOSIS — E782 Mixed hyperlipidemia: Secondary | ICD-10-CM | POA: Diagnosis not present

## 2018-09-28 DIAGNOSIS — E6609 Other obesity due to excess calories: Secondary | ICD-10-CM | POA: Diagnosis not present

## 2018-09-28 DIAGNOSIS — J301 Allergic rhinitis due to pollen: Secondary | ICD-10-CM | POA: Diagnosis not present

## 2018-12-19 DIAGNOSIS — L239 Allergic contact dermatitis, unspecified cause: Secondary | ICD-10-CM | POA: Diagnosis not present

## 2019-02-20 DIAGNOSIS — Z23 Encounter for immunization: Secondary | ICD-10-CM | POA: Diagnosis not present

## 2019-03-01 DIAGNOSIS — M25512 Pain in left shoulder: Secondary | ICD-10-CM | POA: Diagnosis not present

## 2019-03-01 DIAGNOSIS — M19012 Primary osteoarthritis, left shoulder: Secondary | ICD-10-CM | POA: Diagnosis not present

## 2019-03-02 DIAGNOSIS — M25412 Effusion, left shoulder: Secondary | ICD-10-CM | POA: Diagnosis not present

## 2019-03-02 DIAGNOSIS — M25512 Pain in left shoulder: Secondary | ICD-10-CM | POA: Diagnosis not present

## 2019-03-02 DIAGNOSIS — M19012 Primary osteoarthritis, left shoulder: Secondary | ICD-10-CM | POA: Diagnosis not present

## 2019-03-02 DIAGNOSIS — G8929 Other chronic pain: Secondary | ICD-10-CM | POA: Diagnosis not present

## 2019-03-10 DIAGNOSIS — G8929 Other chronic pain: Secondary | ICD-10-CM | POA: Diagnosis not present

## 2019-03-10 DIAGNOSIS — S46012A Strain of muscle(s) and tendon(s) of the rotator cuff of left shoulder, initial encounter: Secondary | ICD-10-CM | POA: Diagnosis not present

## 2019-03-10 DIAGNOSIS — I700 Atherosclerosis of aorta: Secondary | ICD-10-CM | POA: Diagnosis not present

## 2019-03-10 DIAGNOSIS — M25512 Pain in left shoulder: Secondary | ICD-10-CM | POA: Diagnosis not present

## 2019-03-15 DIAGNOSIS — M19012 Primary osteoarthritis, left shoulder: Secondary | ICD-10-CM | POA: Diagnosis not present

## 2019-03-15 DIAGNOSIS — M75102 Unspecified rotator cuff tear or rupture of left shoulder, not specified as traumatic: Secondary | ICD-10-CM | POA: Diagnosis not present

## 2019-03-15 DIAGNOSIS — M67814 Other specified disorders of tendon, left shoulder: Secondary | ICD-10-CM | POA: Diagnosis not present

## 2019-03-31 DIAGNOSIS — E782 Mixed hyperlipidemia: Secondary | ICD-10-CM | POA: Diagnosis not present

## 2019-03-31 DIAGNOSIS — E039 Hypothyroidism, unspecified: Secondary | ICD-10-CM | POA: Diagnosis not present

## 2019-03-31 DIAGNOSIS — I10 Essential (primary) hypertension: Secondary | ICD-10-CM | POA: Diagnosis not present

## 2019-03-31 DIAGNOSIS — E038 Other specified hypothyroidism: Secondary | ICD-10-CM | POA: Diagnosis not present

## 2019-04-04 DIAGNOSIS — Z0001 Encounter for general adult medical examination with abnormal findings: Secondary | ICD-10-CM | POA: Diagnosis not present

## 2019-04-04 DIAGNOSIS — Z6830 Body mass index (BMI) 30.0-30.9, adult: Secondary | ICD-10-CM | POA: Diagnosis not present

## 2019-04-04 DIAGNOSIS — I10 Essential (primary) hypertension: Secondary | ICD-10-CM | POA: Diagnosis not present

## 2019-04-04 DIAGNOSIS — E039 Hypothyroidism, unspecified: Secondary | ICD-10-CM | POA: Diagnosis not present

## 2019-04-04 DIAGNOSIS — E782 Mixed hyperlipidemia: Secondary | ICD-10-CM | POA: Diagnosis not present

## 2019-04-04 DIAGNOSIS — I700 Atherosclerosis of aorta: Secondary | ICD-10-CM | POA: Diagnosis not present

## 2019-04-04 DIAGNOSIS — Z1212 Encounter for screening for malignant neoplasm of rectum: Secondary | ICD-10-CM | POA: Diagnosis not present

## 2019-04-04 DIAGNOSIS — F331 Major depressive disorder, recurrent, moderate: Secondary | ICD-10-CM | POA: Diagnosis not present

## 2019-04-17 DIAGNOSIS — L821 Other seborrheic keratosis: Secondary | ICD-10-CM | POA: Diagnosis not present

## 2019-04-17 DIAGNOSIS — D1801 Hemangioma of skin and subcutaneous tissue: Secondary | ICD-10-CM | POA: Diagnosis not present

## 2019-04-17 DIAGNOSIS — L570 Actinic keratosis: Secondary | ICD-10-CM | POA: Diagnosis not present

## 2019-04-17 DIAGNOSIS — L219 Seborrheic dermatitis, unspecified: Secondary | ICD-10-CM | POA: Diagnosis not present

## 2019-04-20 DIAGNOSIS — M79671 Pain in right foot: Secondary | ICD-10-CM | POA: Diagnosis not present

## 2019-04-20 DIAGNOSIS — L110 Acquired keratosis follicularis: Secondary | ICD-10-CM | POA: Diagnosis not present

## 2019-04-20 DIAGNOSIS — I739 Peripheral vascular disease, unspecified: Secondary | ICD-10-CM | POA: Diagnosis not present

## 2019-04-20 DIAGNOSIS — M79672 Pain in left foot: Secondary | ICD-10-CM | POA: Diagnosis not present

## 2019-05-01 DIAGNOSIS — I10 Essential (primary) hypertension: Secondary | ICD-10-CM | POA: Diagnosis not present

## 2019-05-01 DIAGNOSIS — E782 Mixed hyperlipidemia: Secondary | ICD-10-CM | POA: Diagnosis not present

## 2019-05-03 DIAGNOSIS — M19012 Primary osteoarthritis, left shoulder: Secondary | ICD-10-CM | POA: Diagnosis not present

## 2019-05-03 DIAGNOSIS — M75102 Unspecified rotator cuff tear or rupture of left shoulder, not specified as traumatic: Secondary | ICD-10-CM | POA: Diagnosis not present

## 2019-06-01 DIAGNOSIS — I10 Essential (primary) hypertension: Secondary | ICD-10-CM | POA: Diagnosis not present

## 2019-06-01 DIAGNOSIS — E782 Mixed hyperlipidemia: Secondary | ICD-10-CM | POA: Diagnosis not present

## 2019-06-13 DIAGNOSIS — Z1231 Encounter for screening mammogram for malignant neoplasm of breast: Secondary | ICD-10-CM | POA: Diagnosis not present

## 2019-06-22 DIAGNOSIS — M85852 Other specified disorders of bone density and structure, left thigh: Secondary | ICD-10-CM | POA: Diagnosis not present

## 2019-06-22 DIAGNOSIS — M8588 Other specified disorders of bone density and structure, other site: Secondary | ICD-10-CM | POA: Diagnosis not present

## 2019-06-22 DIAGNOSIS — M810 Age-related osteoporosis without current pathological fracture: Secondary | ICD-10-CM | POA: Diagnosis not present

## 2019-06-30 DIAGNOSIS — I10 Essential (primary) hypertension: Secondary | ICD-10-CM | POA: Diagnosis not present

## 2019-06-30 DIAGNOSIS — E039 Hypothyroidism, unspecified: Secondary | ICD-10-CM | POA: Diagnosis not present

## 2019-07-07 DIAGNOSIS — Z23 Encounter for immunization: Secondary | ICD-10-CM | POA: Diagnosis not present

## 2019-07-13 DIAGNOSIS — Z6831 Body mass index (BMI) 31.0-31.9, adult: Secondary | ICD-10-CM | POA: Diagnosis not present

## 2019-07-13 DIAGNOSIS — R300 Dysuria: Secondary | ICD-10-CM | POA: Diagnosis not present

## 2019-07-13 DIAGNOSIS — N3090 Cystitis, unspecified without hematuria: Secondary | ICD-10-CM | POA: Diagnosis not present

## 2019-08-05 DIAGNOSIS — Z23 Encounter for immunization: Secondary | ICD-10-CM | POA: Diagnosis not present

## 2019-08-30 DIAGNOSIS — E7849 Other hyperlipidemia: Secondary | ICD-10-CM | POA: Diagnosis not present

## 2019-08-30 DIAGNOSIS — I10 Essential (primary) hypertension: Secondary | ICD-10-CM | POA: Diagnosis not present

## 2019-09-27 DIAGNOSIS — Z9189 Other specified personal risk factors, not elsewhere classified: Secondary | ICD-10-CM | POA: Diagnosis not present

## 2019-09-27 DIAGNOSIS — E782 Mixed hyperlipidemia: Secondary | ICD-10-CM | POA: Diagnosis not present

## 2019-09-27 DIAGNOSIS — R946 Abnormal results of thyroid function studies: Secondary | ICD-10-CM | POA: Diagnosis not present

## 2019-09-27 DIAGNOSIS — I10 Essential (primary) hypertension: Secondary | ICD-10-CM | POA: Diagnosis not present

## 2019-10-04 DIAGNOSIS — I10 Essential (primary) hypertension: Secondary | ICD-10-CM | POA: Diagnosis not present

## 2019-10-04 DIAGNOSIS — E039 Hypothyroidism, unspecified: Secondary | ICD-10-CM | POA: Diagnosis not present

## 2019-10-04 DIAGNOSIS — Z6830 Body mass index (BMI) 30.0-30.9, adult: Secondary | ICD-10-CM | POA: Diagnosis not present

## 2019-10-04 DIAGNOSIS — F331 Major depressive disorder, recurrent, moderate: Secondary | ICD-10-CM | POA: Diagnosis not present

## 2019-10-04 DIAGNOSIS — N1831 Chronic kidney disease, stage 3a: Secondary | ICD-10-CM | POA: Diagnosis not present

## 2019-10-04 DIAGNOSIS — E782 Mixed hyperlipidemia: Secondary | ICD-10-CM | POA: Diagnosis not present

## 2019-10-04 DIAGNOSIS — I700 Atherosclerosis of aorta: Secondary | ICD-10-CM | POA: Diagnosis not present

## 2019-10-04 DIAGNOSIS — J301 Allergic rhinitis due to pollen: Secondary | ICD-10-CM | POA: Diagnosis not present

## 2019-11-16 DIAGNOSIS — E039 Hypothyroidism, unspecified: Secondary | ICD-10-CM | POA: Diagnosis not present

## 2019-11-20 DIAGNOSIS — M25512 Pain in left shoulder: Secondary | ICD-10-CM | POA: Diagnosis not present

## 2019-11-24 DIAGNOSIS — M25512 Pain in left shoulder: Secondary | ICD-10-CM | POA: Diagnosis not present

## 2019-11-27 DIAGNOSIS — M25512 Pain in left shoulder: Secondary | ICD-10-CM | POA: Diagnosis not present

## 2019-11-30 DIAGNOSIS — M25512 Pain in left shoulder: Secondary | ICD-10-CM | POA: Diagnosis not present

## 2019-12-05 DIAGNOSIS — M25512 Pain in left shoulder: Secondary | ICD-10-CM | POA: Diagnosis not present

## 2019-12-08 DIAGNOSIS — M25512 Pain in left shoulder: Secondary | ICD-10-CM | POA: Diagnosis not present

## 2019-12-19 DIAGNOSIS — Z6830 Body mass index (BMI) 30.0-30.9, adult: Secondary | ICD-10-CM | POA: Diagnosis not present

## 2019-12-19 DIAGNOSIS — J209 Acute bronchitis, unspecified: Secondary | ICD-10-CM | POA: Diagnosis not present

## 2019-12-27 DIAGNOSIS — J209 Acute bronchitis, unspecified: Secondary | ICD-10-CM | POA: Diagnosis not present

## 2020-01-04 DIAGNOSIS — Z20828 Contact with and (suspected) exposure to other viral communicable diseases: Secondary | ICD-10-CM | POA: Diagnosis not present

## 2020-04-04 DIAGNOSIS — E782 Mixed hyperlipidemia: Secondary | ICD-10-CM | POA: Diagnosis not present

## 2020-04-04 DIAGNOSIS — E039 Hypothyroidism, unspecified: Secondary | ICD-10-CM | POA: Diagnosis not present

## 2020-04-04 DIAGNOSIS — I10 Essential (primary) hypertension: Secondary | ICD-10-CM | POA: Diagnosis not present

## 2020-04-04 DIAGNOSIS — Z23 Encounter for immunization: Secondary | ICD-10-CM | POA: Diagnosis not present

## 2020-04-04 DIAGNOSIS — E038 Other specified hypothyroidism: Secondary | ICD-10-CM | POA: Diagnosis not present

## 2020-04-04 DIAGNOSIS — N1831 Chronic kidney disease, stage 3a: Secondary | ICD-10-CM | POA: Diagnosis not present

## 2020-04-08 DIAGNOSIS — Z6830 Body mass index (BMI) 30.0-30.9, adult: Secondary | ICD-10-CM | POA: Diagnosis not present

## 2020-04-08 DIAGNOSIS — I700 Atherosclerosis of aorta: Secondary | ICD-10-CM | POA: Diagnosis not present

## 2020-04-08 DIAGNOSIS — Z0001 Encounter for general adult medical examination with abnormal findings: Secondary | ICD-10-CM | POA: Diagnosis not present

## 2020-04-08 DIAGNOSIS — E782 Mixed hyperlipidemia: Secondary | ICD-10-CM | POA: Diagnosis not present

## 2020-04-08 DIAGNOSIS — F331 Major depressive disorder, recurrent, moderate: Secondary | ICD-10-CM | POA: Diagnosis not present

## 2020-04-08 DIAGNOSIS — N1831 Chronic kidney disease, stage 3a: Secondary | ICD-10-CM | POA: Diagnosis not present

## 2020-04-08 DIAGNOSIS — I10 Essential (primary) hypertension: Secondary | ICD-10-CM | POA: Diagnosis not present

## 2020-04-08 DIAGNOSIS — J301 Allergic rhinitis due to pollen: Secondary | ICD-10-CM | POA: Diagnosis not present

## 2020-04-10 DIAGNOSIS — L02213 Cutaneous abscess of chest wall: Secondary | ICD-10-CM | POA: Diagnosis not present

## 2020-04-10 DIAGNOSIS — L0100 Impetigo, unspecified: Secondary | ICD-10-CM | POA: Diagnosis not present

## 2020-04-10 DIAGNOSIS — L723 Sebaceous cyst: Secondary | ICD-10-CM | POA: Diagnosis not present

## 2020-04-16 DIAGNOSIS — D485 Neoplasm of uncertain behavior of skin: Secondary | ICD-10-CM | POA: Diagnosis not present

## 2020-04-18 DIAGNOSIS — L720 Epidermal cyst: Secondary | ICD-10-CM | POA: Diagnosis not present

## 2020-04-18 DIAGNOSIS — D485 Neoplasm of uncertain behavior of skin: Secondary | ICD-10-CM | POA: Diagnosis not present

## 2020-05-02 DIAGNOSIS — Z23 Encounter for immunization: Secondary | ICD-10-CM | POA: Diagnosis not present

## 2020-06-04 DIAGNOSIS — H40003 Preglaucoma, unspecified, bilateral: Secondary | ICD-10-CM | POA: Diagnosis not present

## 2020-07-30 DIAGNOSIS — Z1231 Encounter for screening mammogram for malignant neoplasm of breast: Secondary | ICD-10-CM | POA: Diagnosis not present

## 2020-08-10 DIAGNOSIS — L539 Erythematous condition, unspecified: Secondary | ICD-10-CM | POA: Diagnosis not present

## 2020-08-10 DIAGNOSIS — H00039 Abscess of eyelid unspecified eye, unspecified eyelid: Secondary | ICD-10-CM | POA: Diagnosis not present

## 2020-10-01 DIAGNOSIS — I10 Essential (primary) hypertension: Secondary | ICD-10-CM | POA: Diagnosis not present

## 2020-10-01 DIAGNOSIS — E7849 Other hyperlipidemia: Secondary | ICD-10-CM | POA: Diagnosis not present

## 2020-10-01 DIAGNOSIS — N1831 Chronic kidney disease, stage 3a: Secondary | ICD-10-CM | POA: Diagnosis not present

## 2020-10-01 DIAGNOSIS — E038 Other specified hypothyroidism: Secondary | ICD-10-CM | POA: Diagnosis not present

## 2020-10-01 DIAGNOSIS — E782 Mixed hyperlipidemia: Secondary | ICD-10-CM | POA: Diagnosis not present

## 2020-10-04 DIAGNOSIS — E039 Hypothyroidism, unspecified: Secondary | ICD-10-CM | POA: Diagnosis not present

## 2020-10-04 DIAGNOSIS — Z23 Encounter for immunization: Secondary | ICD-10-CM | POA: Diagnosis not present

## 2020-10-04 DIAGNOSIS — I700 Atherosclerosis of aorta: Secondary | ICD-10-CM | POA: Diagnosis not present

## 2020-10-04 DIAGNOSIS — Z1331 Encounter for screening for depression: Secondary | ICD-10-CM | POA: Diagnosis not present

## 2020-10-04 DIAGNOSIS — N1831 Chronic kidney disease, stage 3a: Secondary | ICD-10-CM | POA: Diagnosis not present

## 2020-10-04 DIAGNOSIS — Z1389 Encounter for screening for other disorder: Secondary | ICD-10-CM | POA: Diagnosis not present

## 2020-10-04 DIAGNOSIS — I10 Essential (primary) hypertension: Secondary | ICD-10-CM | POA: Diagnosis not present

## 2020-10-04 DIAGNOSIS — E7849 Other hyperlipidemia: Secondary | ICD-10-CM | POA: Diagnosis not present

## 2020-11-05 DIAGNOSIS — Z20828 Contact with and (suspected) exposure to other viral communicable diseases: Secondary | ICD-10-CM | POA: Diagnosis not present

## 2021-04-01 DIAGNOSIS — Z20828 Contact with and (suspected) exposure to other viral communicable diseases: Secondary | ICD-10-CM | POA: Diagnosis not present

## 2021-04-10 DIAGNOSIS — E782 Mixed hyperlipidemia: Secondary | ICD-10-CM | POA: Diagnosis not present

## 2021-04-10 DIAGNOSIS — I10 Essential (primary) hypertension: Secondary | ICD-10-CM | POA: Diagnosis not present

## 2021-04-10 DIAGNOSIS — N1831 Chronic kidney disease, stage 3a: Secondary | ICD-10-CM | POA: Diagnosis not present

## 2021-04-10 DIAGNOSIS — E7849 Other hyperlipidemia: Secondary | ICD-10-CM | POA: Diagnosis not present

## 2021-04-14 DIAGNOSIS — N1831 Chronic kidney disease, stage 3a: Secondary | ICD-10-CM | POA: Diagnosis not present

## 2021-04-14 DIAGNOSIS — I10 Essential (primary) hypertension: Secondary | ICD-10-CM | POA: Diagnosis not present

## 2021-04-14 DIAGNOSIS — J301 Allergic rhinitis due to pollen: Secondary | ICD-10-CM | POA: Diagnosis not present

## 2021-04-14 DIAGNOSIS — I700 Atherosclerosis of aorta: Secondary | ICD-10-CM | POA: Diagnosis not present

## 2021-04-14 DIAGNOSIS — Z23 Encounter for immunization: Secondary | ICD-10-CM | POA: Diagnosis not present

## 2021-04-14 DIAGNOSIS — Z0001 Encounter for general adult medical examination with abnormal findings: Secondary | ICD-10-CM | POA: Diagnosis not present

## 2021-04-14 DIAGNOSIS — E039 Hypothyroidism, unspecified: Secondary | ICD-10-CM | POA: Diagnosis not present

## 2021-04-14 DIAGNOSIS — E7849 Other hyperlipidemia: Secondary | ICD-10-CM | POA: Diagnosis not present

## 2021-04-16 DIAGNOSIS — L570 Actinic keratosis: Secondary | ICD-10-CM | POA: Diagnosis not present

## 2021-07-07 DIAGNOSIS — M25562 Pain in left knee: Secondary | ICD-10-CM | POA: Diagnosis not present

## 2021-07-07 DIAGNOSIS — M25561 Pain in right knee: Secondary | ICD-10-CM | POA: Diagnosis not present

## 2021-07-07 DIAGNOSIS — I10 Essential (primary) hypertension: Secondary | ICD-10-CM | POA: Diagnosis not present

## 2021-09-24 DIAGNOSIS — H2513 Age-related nuclear cataract, bilateral: Secondary | ICD-10-CM | POA: Diagnosis not present

## 2021-09-24 DIAGNOSIS — H40033 Anatomical narrow angle, bilateral: Secondary | ICD-10-CM | POA: Diagnosis not present

## 2021-10-08 DIAGNOSIS — E039 Hypothyroidism, unspecified: Secondary | ICD-10-CM | POA: Diagnosis not present

## 2021-10-08 DIAGNOSIS — I10 Essential (primary) hypertension: Secondary | ICD-10-CM | POA: Diagnosis not present

## 2021-10-08 DIAGNOSIS — E7849 Other hyperlipidemia: Secondary | ICD-10-CM | POA: Diagnosis not present

## 2021-10-08 DIAGNOSIS — E782 Mixed hyperlipidemia: Secondary | ICD-10-CM | POA: Diagnosis not present

## 2021-10-08 DIAGNOSIS — N1831 Chronic kidney disease, stage 3a: Secondary | ICD-10-CM | POA: Diagnosis not present

## 2021-10-13 DIAGNOSIS — I700 Atherosclerosis of aorta: Secondary | ICD-10-CM | POA: Diagnosis not present

## 2021-10-13 DIAGNOSIS — J301 Allergic rhinitis due to pollen: Secondary | ICD-10-CM | POA: Diagnosis not present

## 2021-10-13 DIAGNOSIS — I10 Essential (primary) hypertension: Secondary | ICD-10-CM | POA: Diagnosis not present

## 2021-10-13 DIAGNOSIS — E7849 Other hyperlipidemia: Secondary | ICD-10-CM | POA: Diagnosis not present

## 2021-10-13 DIAGNOSIS — E039 Hypothyroidism, unspecified: Secondary | ICD-10-CM | POA: Diagnosis not present

## 2021-10-16 DIAGNOSIS — Z1231 Encounter for screening mammogram for malignant neoplasm of breast: Secondary | ICD-10-CM | POA: Diagnosis not present

## 2022-04-27 DIAGNOSIS — E038 Other specified hypothyroidism: Secondary | ICD-10-CM | POA: Diagnosis not present

## 2022-04-27 DIAGNOSIS — E7849 Other hyperlipidemia: Secondary | ICD-10-CM | POA: Diagnosis not present

## 2022-04-27 DIAGNOSIS — I10 Essential (primary) hypertension: Secondary | ICD-10-CM | POA: Diagnosis not present

## 2022-04-27 DIAGNOSIS — N1831 Chronic kidney disease, stage 3a: Secondary | ICD-10-CM | POA: Diagnosis not present

## 2022-04-27 DIAGNOSIS — Z0001 Encounter for general adult medical examination with abnormal findings: Secondary | ICD-10-CM | POA: Diagnosis not present

## 2022-04-30 DIAGNOSIS — E7849 Other hyperlipidemia: Secondary | ICD-10-CM | POA: Diagnosis not present

## 2022-04-30 DIAGNOSIS — E039 Hypothyroidism, unspecified: Secondary | ICD-10-CM | POA: Diagnosis not present

## 2022-04-30 DIAGNOSIS — I10 Essential (primary) hypertension: Secondary | ICD-10-CM | POA: Diagnosis not present

## 2022-04-30 DIAGNOSIS — I700 Atherosclerosis of aorta: Secondary | ICD-10-CM | POA: Diagnosis not present

## 2022-04-30 DIAGNOSIS — Z23 Encounter for immunization: Secondary | ICD-10-CM | POA: Diagnosis not present

## 2022-04-30 DIAGNOSIS — K2100 Gastro-esophageal reflux disease with esophagitis, without bleeding: Secondary | ICD-10-CM | POA: Diagnosis not present

## 2022-04-30 DIAGNOSIS — J301 Allergic rhinitis due to pollen: Secondary | ICD-10-CM | POA: Diagnosis not present

## 2022-04-30 DIAGNOSIS — Z0001 Encounter for general adult medical examination with abnormal findings: Secondary | ICD-10-CM | POA: Diagnosis not present

## 2022-06-29 DIAGNOSIS — L723 Sebaceous cyst: Secondary | ICD-10-CM | POA: Diagnosis not present

## 2022-07-09 DIAGNOSIS — L928 Other granulomatous disorders of the skin and subcutaneous tissue: Secondary | ICD-10-CM | POA: Diagnosis not present

## 2022-07-09 DIAGNOSIS — L089 Local infection of the skin and subcutaneous tissue, unspecified: Secondary | ICD-10-CM | POA: Diagnosis not present

## 2022-07-09 DIAGNOSIS — D485 Neoplasm of uncertain behavior of skin: Secondary | ICD-10-CM | POA: Diagnosis not present

## 2022-07-09 DIAGNOSIS — L720 Epidermal cyst: Secondary | ICD-10-CM | POA: Diagnosis not present

## 2022-07-23 DIAGNOSIS — L570 Actinic keratosis: Secondary | ICD-10-CM | POA: Diagnosis not present

## 2022-08-27 DIAGNOSIS — D485 Neoplasm of uncertain behavior of skin: Secondary | ICD-10-CM | POA: Diagnosis not present

## 2022-08-27 DIAGNOSIS — C44729 Squamous cell carcinoma of skin of left lower limb, including hip: Secondary | ICD-10-CM | POA: Diagnosis not present

## 2022-09-03 DIAGNOSIS — C44729 Squamous cell carcinoma of skin of left lower limb, including hip: Secondary | ICD-10-CM | POA: Diagnosis not present

## 2022-09-17 DIAGNOSIS — L0100 Impetigo, unspecified: Secondary | ICD-10-CM | POA: Diagnosis not present

## 2022-10-19 DIAGNOSIS — N1831 Chronic kidney disease, stage 3a: Secondary | ICD-10-CM | POA: Diagnosis not present

## 2022-10-19 DIAGNOSIS — K2100 Gastro-esophageal reflux disease with esophagitis, without bleeding: Secondary | ICD-10-CM | POA: Diagnosis not present

## 2022-10-19 DIAGNOSIS — E039 Hypothyroidism, unspecified: Secondary | ICD-10-CM | POA: Diagnosis not present

## 2022-10-19 DIAGNOSIS — E7849 Other hyperlipidemia: Secondary | ICD-10-CM | POA: Diagnosis not present

## 2022-10-19 DIAGNOSIS — E038 Other specified hypothyroidism: Secondary | ICD-10-CM | POA: Diagnosis not present

## 2022-10-19 DIAGNOSIS — I10 Essential (primary) hypertension: Secondary | ICD-10-CM | POA: Diagnosis not present

## 2022-10-27 DIAGNOSIS — E7849 Other hyperlipidemia: Secondary | ICD-10-CM | POA: Diagnosis not present

## 2022-10-27 DIAGNOSIS — E039 Hypothyroidism, unspecified: Secondary | ICD-10-CM | POA: Diagnosis not present

## 2022-10-27 DIAGNOSIS — I10 Essential (primary) hypertension: Secondary | ICD-10-CM | POA: Diagnosis not present

## 2022-10-27 DIAGNOSIS — J301 Allergic rhinitis due to pollen: Secondary | ICD-10-CM | POA: Diagnosis not present

## 2022-10-27 DIAGNOSIS — K2100 Gastro-esophageal reflux disease with esophagitis, without bleeding: Secondary | ICD-10-CM | POA: Diagnosis not present

## 2022-10-27 DIAGNOSIS — I700 Atherosclerosis of aorta: Secondary | ICD-10-CM | POA: Diagnosis not present

## 2022-11-09 DIAGNOSIS — S2232XA Fracture of one rib, left side, initial encounter for closed fracture: Secondary | ICD-10-CM | POA: Diagnosis not present

## 2022-11-09 DIAGNOSIS — R030 Elevated blood-pressure reading, without diagnosis of hypertension: Secondary | ICD-10-CM | POA: Diagnosis not present

## 2022-11-09 DIAGNOSIS — S42295A Other nondisplaced fracture of upper end of left humerus, initial encounter for closed fracture: Secondary | ICD-10-CM | POA: Diagnosis not present

## 2022-11-09 DIAGNOSIS — R0789 Other chest pain: Secondary | ICD-10-CM | POA: Diagnosis not present

## 2022-11-09 DIAGNOSIS — S2020XA Contusion of thorax, unspecified, initial encounter: Secondary | ICD-10-CM | POA: Diagnosis not present

## 2022-11-09 DIAGNOSIS — M25512 Pain in left shoulder: Secondary | ICD-10-CM | POA: Diagnosis not present

## 2022-11-17 DIAGNOSIS — R0781 Pleurodynia: Secondary | ICD-10-CM | POA: Diagnosis not present

## 2022-11-17 DIAGNOSIS — M25512 Pain in left shoulder: Secondary | ICD-10-CM | POA: Diagnosis not present

## 2022-12-07 DIAGNOSIS — S40022A Contusion of left upper arm, initial encounter: Secondary | ICD-10-CM | POA: Diagnosis not present

## 2022-12-11 DIAGNOSIS — S40022A Contusion of left upper arm, initial encounter: Secondary | ICD-10-CM | POA: Diagnosis not present

## 2022-12-14 DIAGNOSIS — S40022A Contusion of left upper arm, initial encounter: Secondary | ICD-10-CM | POA: Diagnosis not present

## 2022-12-16 DIAGNOSIS — S40022A Contusion of left upper arm, initial encounter: Secondary | ICD-10-CM | POA: Diagnosis not present

## 2022-12-21 DIAGNOSIS — S40022A Contusion of left upper arm, initial encounter: Secondary | ICD-10-CM | POA: Diagnosis not present

## 2022-12-23 DIAGNOSIS — S40022A Contusion of left upper arm, initial encounter: Secondary | ICD-10-CM | POA: Diagnosis not present

## 2022-12-31 ENCOUNTER — Emergency Department (HOSPITAL_COMMUNITY): Payer: Medicare Other

## 2022-12-31 ENCOUNTER — Inpatient Hospital Stay (HOSPITAL_COMMUNITY)
Admission: EM | Admit: 2022-12-31 | Discharge: 2023-01-04 | DRG: 291 | Disposition: A | Discharge status: Home / Self Care | Payer: Medicare Other | Attending: Family Medicine | Admitting: Family Medicine

## 2022-12-31 ENCOUNTER — Other Ambulatory Visit: Payer: Self-pay

## 2022-12-31 ENCOUNTER — Encounter (HOSPITAL_COMMUNITY): Payer: Self-pay | Admitting: Emergency Medicine

## 2022-12-31 DIAGNOSIS — Z7982 Long term (current) use of aspirin: Secondary | ICD-10-CM

## 2022-12-31 DIAGNOSIS — Z8249 Family history of ischemic heart disease and other diseases of the circulatory system: Secondary | ICD-10-CM | POA: Diagnosis not present

## 2022-12-31 DIAGNOSIS — R54 Age-related physical debility: Secondary | ICD-10-CM | POA: Diagnosis present

## 2022-12-31 DIAGNOSIS — I700 Atherosclerosis of aorta: Secondary | ICD-10-CM | POA: Diagnosis not present

## 2022-12-31 DIAGNOSIS — Z888 Allergy status to other drugs, medicaments and biological substances status: Secondary | ICD-10-CM

## 2022-12-31 DIAGNOSIS — E7849 Other hyperlipidemia: Secondary | ICD-10-CM | POA: Diagnosis not present

## 2022-12-31 DIAGNOSIS — Z800 Family history of malignant neoplasm of digestive organs: Secondary | ICD-10-CM | POA: Diagnosis not present

## 2022-12-31 DIAGNOSIS — Z79890 Hormone replacement therapy (postmenopausal): Secondary | ICD-10-CM

## 2022-12-31 DIAGNOSIS — J9811 Atelectasis: Secondary | ICD-10-CM | POA: Diagnosis not present

## 2022-12-31 DIAGNOSIS — I5021 Acute systolic (congestive) heart failure: Secondary | ICD-10-CM | POA: Diagnosis not present

## 2022-12-31 DIAGNOSIS — I4891 Unspecified atrial fibrillation: Secondary | ICD-10-CM | POA: Diagnosis not present

## 2022-12-31 DIAGNOSIS — Z833 Family history of diabetes mellitus: Secondary | ICD-10-CM | POA: Diagnosis not present

## 2022-12-31 DIAGNOSIS — I509 Heart failure, unspecified: Secondary | ICD-10-CM | POA: Diagnosis not present

## 2022-12-31 DIAGNOSIS — E785 Hyperlipidemia, unspecified: Secondary | ICD-10-CM | POA: Diagnosis present

## 2022-12-31 DIAGNOSIS — R0600 Dyspnea, unspecified: Secondary | ICD-10-CM | POA: Diagnosis not present

## 2022-12-31 DIAGNOSIS — I4892 Unspecified atrial flutter: Secondary | ICD-10-CM | POA: Diagnosis present

## 2022-12-31 DIAGNOSIS — J90 Pleural effusion, not elsewhere classified: Secondary | ICD-10-CM | POA: Diagnosis not present

## 2022-12-31 DIAGNOSIS — Z87891 Personal history of nicotine dependence: Secondary | ICD-10-CM

## 2022-12-31 DIAGNOSIS — Z85820 Personal history of malignant melanoma of skin: Secondary | ICD-10-CM | POA: Diagnosis not present

## 2022-12-31 DIAGNOSIS — I447 Left bundle-branch block, unspecified: Secondary | ICD-10-CM | POA: Diagnosis not present

## 2022-12-31 DIAGNOSIS — G4733 Obstructive sleep apnea (adult) (pediatric): Secondary | ICD-10-CM | POA: Diagnosis not present

## 2022-12-31 DIAGNOSIS — Z7901 Long term (current) use of anticoagulants: Secondary | ICD-10-CM | POA: Diagnosis not present

## 2022-12-31 DIAGNOSIS — I110 Hypertensive heart disease with heart failure: Principal | ICD-10-CM | POA: Diagnosis present

## 2022-12-31 DIAGNOSIS — K219 Gastro-esophageal reflux disease without esophagitis: Secondary | ICD-10-CM | POA: Diagnosis present

## 2022-12-31 DIAGNOSIS — E039 Hypothyroidism, unspecified: Secondary | ICD-10-CM | POA: Diagnosis present

## 2022-12-31 DIAGNOSIS — R000 Tachycardia, unspecified: Secondary | ICD-10-CM | POA: Diagnosis not present

## 2022-12-31 DIAGNOSIS — I428 Other cardiomyopathies: Secondary | ICD-10-CM | POA: Diagnosis present

## 2022-12-31 DIAGNOSIS — Z79899 Other long term (current) drug therapy: Secondary | ICD-10-CM

## 2022-12-31 DIAGNOSIS — I10 Essential (primary) hypertension: Secondary | ICD-10-CM | POA: Diagnosis not present

## 2022-12-31 DIAGNOSIS — N179 Acute kidney failure, unspecified: Secondary | ICD-10-CM | POA: Diagnosis present

## 2022-12-31 DIAGNOSIS — R0602 Shortness of breath: Principal | ICD-10-CM

## 2022-12-31 DIAGNOSIS — I43 Cardiomyopathy in diseases classified elsewhere: Secondary | ICD-10-CM | POA: Diagnosis not present

## 2022-12-31 LAB — COMPREHENSIVE METABOLIC PANEL
ALT: 83 U/L — ABNORMAL HIGH (ref 0–44)
AST: 46 U/L — ABNORMAL HIGH (ref 15–41)
Albumin: 3.8 g/dL (ref 3.5–5.0)
Alkaline Phosphatase: 81 U/L (ref 38–126)
Anion gap: 6 (ref 5–15)
BUN: 30 mg/dL — ABNORMAL HIGH (ref 8–23)
CO2: 24 mmol/L (ref 22–32)
Calcium: 9.2 mg/dL (ref 8.9–10.3)
Chloride: 109 mmol/L (ref 98–111)
Creatinine, Ser: 1.01 mg/dL — ABNORMAL HIGH (ref 0.44–1.00)
GFR, Estimated: 55 mL/min — ABNORMAL LOW (ref >60)
Glucose, Bld: 120 mg/dL — ABNORMAL HIGH (ref 70–99)
Potassium: 4.5 mmol/L (ref 3.5–5.1)
Sodium: 139 mmol/L (ref 135–145)
Total Bilirubin: 0.7 mg/dL (ref 0.3–1.2)
Total Protein: 7 g/dL (ref 6.5–8.1)

## 2022-12-31 LAB — CBC WITH DIFFERENTIAL/PLATELET
Abs Immature Granulocytes: 0.01 10*3/uL (ref 0.00–0.07)
Basophils Absolute: 0 10*3/uL (ref 0.0–0.1)
Basophils Relative: 1 %
Eosinophils Absolute: 0.1 10*3/uL (ref 0.0–0.5)
Eosinophils Relative: 2 %
HCT: 35 % — ABNORMAL LOW (ref 36.0–46.0)
Hemoglobin: 11.1 g/dL — ABNORMAL LOW (ref 12.0–15.0)
Immature Granulocytes: 0 %
Lymphocytes Relative: 25 %
Lymphs Abs: 1.6 10*3/uL (ref 0.7–4.0)
MCH: 30.2 pg (ref 26.0–34.0)
MCHC: 31.7 g/dL (ref 30.0–36.0)
MCV: 95.4 fL (ref 80.0–100.0)
Monocytes Absolute: 0.6 10*3/uL (ref 0.1–1.0)
Monocytes Relative: 9 %
Neutro Abs: 4.1 10*3/uL (ref 1.7–7.7)
Neutrophils Relative %: 63 %
Platelets: 171 10*3/uL (ref 150–400)
RBC: 3.67 MIL/uL — ABNORMAL LOW (ref 3.87–5.11)
RDW: 13.2 % (ref 11.5–15.5)
WBC: 6.5 10*3/uL (ref 4.0–10.5)
nRBC: 0 % (ref 0.0–0.2)

## 2022-12-31 LAB — TROPONIN I (HIGH SENSITIVITY)
Troponin I (High Sensitivity): 10 ng/L (ref <18)
Troponin I (High Sensitivity): 10 ng/L (ref <18)

## 2022-12-31 LAB — BRAIN NATRIURETIC PEPTIDE: B Natriuretic Peptide: 229 pg/mL — ABNORMAL HIGH (ref 0.0–100.0)

## 2022-12-31 MED ORDER — METOPROLOL TARTRATE 5 MG/5ML IV SOLN
5.0000 mg | Freq: Once | INTRAVENOUS | Status: AC
  Filled 2022-12-31: qty 5

## 2022-12-31 MED ORDER — METOPROLOL TARTRATE 5 MG/5ML IV SOLN
INTRAVENOUS | Status: AC
  Administered 2022-12-31: 5 mg via INTRAVENOUS
  Filled 2022-12-31: qty 5

## 2022-12-31 MED ORDER — HEPARIN BOLUS VIA INFUSION
3500.0000 [IU] | Freq: Once | INTRAVENOUS | Status: AC
  Administered 2022-12-31: 3500 [IU] via INTRAVENOUS

## 2022-12-31 MED ORDER — HEPARIN (PORCINE) 25000 UT/250ML-% IV SOLN
850.0000 [IU]/h | INTRAVENOUS | Status: DC
  Administered 2022-12-31: 1000 [IU]/h via INTRAVENOUS
  Filled 2022-12-31: qty 250

## 2022-12-31 NOTE — Progress Notes (Signed)
ANTICOAGULATION CONSULT NOTE - Initial Consult  Pharmacy Consult for heparin Indication: atrial fibrillation  Allergies  Allergen Reactions   Metaxalone Other (See Comments)    Patient Measurements: Height: 5\' 4"  (162.6 cm) Weight: 82.6 kg (182 lb) IBW/kg (Calculated) : 54.7 Heparin Dosing Weight: 72.6 kg  Vital Signs: Temp: 98.7 F (37.1 C) (08/01 2100) Temp Source: Oral (08/01 2100) BP: 141/71 (08/01 2230) Pulse Rate: 79 (08/01 2245)  Labs: Recent Labs    12/31/22 2113  HGB 11.1*  HCT 35.0*  PLT 171  CREATININE 1.01*  TROPONINIHS 10    Estimated Creatinine Clearance: 43.9 mL/min (A) (by C-G formula based on SCr of 1.01 mg/dL (H)).   Medical History: Past Medical History:  Diagnosis Date   Hypertension    requires no med, watches diet-pt states it is "white coat syndrome, it's never high at home, usually 120/60"   Hypothyroid    Melanoma of buttock (HCC) 04/2013   2 sites removed by MD   Assessment: 47 yoF presented with shortness of breath on exertion with elevated heart rate. Pharmacy has been consulted for heparin for atrial fibrillation. Hgb 11.1, plts 171, and no PTA anticoagulation  Goal of Therapy:  Heparin level 0.3-0.7 units/ml Monitor platelets by anticoagulation protocol: Yes   Plan:  Give 3500 units bolus x 1 Start heparin infusion at 1000 units/hr Check anti-Xa level in 8 hours and daily while on heparin Continue to monitor H&H and platelets  Arabella Merles, PharmD. Clinical Pharmacist 12/31/2022 10:56 PM

## 2022-12-31 NOTE — ED Triage Notes (Addendum)
Patient c/o shortness of breath with exertion. Patient sent from PCP for elevated heart rate in the 130's. Patient denies chest pain or dizziness. Heart rate 140's in triage. Patient endorses bilateral leg swelling and 10lb weight gain in the past 10 days.

## 2022-12-31 NOTE — ED Provider Notes (Signed)
[place] [facility]MENT AT [facility] Provider Note   CSN: [id number] Arrival date & time: 12/31/22  2004     History  Chief Complaint  Patient presents with   Shortness of Breath    Kaylee Gilbert is a 82 y.o. female history of hypothyroidism, hypertension presented with shortness of breath for the past 4 days.  Patient was at primary care provider's office when her heart was noted to be in the 140s and that she has bilateral leg swelling and is gained 10 pounds over the past few days.  Patient denies any history of CHF or kidney disease.  Patient denies any chest pain with the shortness of breath or recent travel.  Patient states he takes aspirin, Synthroid for medications.    Home Medications Prior to Admission medications   Medication Sig Start Date End Date Taking? Authorizing Provider  aspirin EC 81 MG tablet Take 81 mg by mouth daily.    [provider]  cetirizine (ZYRTEC) 10 MG tablet Take 10 mg by mouth daily as needed for allergies.    [provider]  levothyroxine (SYNTHROID, LEVOTHROID) 75 MCG tablet Take 75 mcg by mouth daily before breakfast.    [provider]  Multiple Vitamins-Minerals (HAIR/SKIN/NAILS PO) Take 1 tablet by mouth daily as needed (brittle nails).    [provider]      Allergies    Metaxalone    Review of Systems   Review of Systems  Respiratory:  Positive for shortness of breath.     Physical Exam Updated Vital Signs BP (!) 141/71   Pulse 79   Temp 98.7 F (37.1 C) (Oral)   Resp 16   Ht 5\' 4"  (1.626 m)   Wt 82.6 kg   SpO2 95%   BMI 31.24 kg/m  Physical Exam Vitals reviewed.  Constitutional:      General: She is not in acute distress. HENT:     Head: Normocephalic and atraumatic.  Eyes:     Extraocular Movements: Extraocular movements intact.     Conjunctiva/sclera: Conjunctivae normal.     Pupils: Pupils are equal, round, and reactive to light.  Cardiovascular:      Rate and Rhythm: Tachycardia present. Rhythm irregular.     Pulses: Normal pulses.     Heart sounds: Normal heart sounds.     Comments: 2+ bilateral radial/dorsalis pedis pulses with increased rate, irregular Pulmonary:     Effort: Pulmonary effort is normal. No respiratory distress.     Breath sounds: Rhonchi (Right greater than left) present.  Abdominal:     Palpations: Abdomen is soft.     Tenderness: There is no abdominal tenderness. There is no guarding or rebound.  Musculoskeletal:        General: Normal range of motion.     Cervical back: Normal range of motion and neck supple.     Comments: 5 out of 5 bilateral grip/leg extension strength  Skin:    General: Skin is warm and dry.     Capillary Refill: Capillary refill takes less than 2 seconds.  Neurological:     General: No focal deficit present.     Mental Status: She is alert and oriented to person, place, and time.     Comments: Sensation intact in all 4 limbs  Psychiatric:        Mood and Affect: Mood normal.     ED Results / Procedures / Treatments   Labs (all labs ordered are listed, but  only abnormal results are displayed) Labs Reviewed  BRAIN NATRIURETIC PEPTIDE - Abnormal; Notable for the following components:      Result Value   B Natriuretic Peptide 229.0 (*)    All other components within normal limits  CBC WITH DIFFERENTIAL/PLATELET - Abnormal; Notable for the following components:   RBC 3.67 (*)    Hemoglobin 11.1 (*)    HCT 35.0 (*)    All other components within normal limits  COMPREHENSIVE METABOLIC PANEL - Abnormal; Notable for the following components:   Glucose, Bld 120 (*)    BUN 30 (*)    Creatinine, Ser 1.01 (*)    AST 46 (*)    ALT 83 (*)    GFR, Estimated 55 (*)    All other components within normal limits  TSH  TROPONIN I (HIGH SENSITIVITY)  TROPONIN I (HIGH SENSITIVITY)    EKG EKG Interpretation Date/Time:  Thursday December 31 2022 21:03:54 EDT Ventricular Rate:  131 PR  Interval:    QRS Duration:  116 QT Interval:  354 QTC Calculation: 522 R Axis:   113  Text Interpretation: atrial fibrillation RVR Right axis deviation Septal infarct , age undetermined Abnormal ECG Confirmed by Vonita Moss [phone]) on 12/31/2022 9:37:00 PM  Radiology DG Chest 2 View  Result Date: 12/31/2022 CLINICAL DATA:  shob EXAM: CHEST - 2 VIEW.  Patient is rotated. COMPARISON:  None Available. FINDINGS: The heart and mediastinal contours are within normal limits. Biapical pleural/pulmonary scarring. Right base atelectasis. No pulmonary edema. Trace right pleural effusion. No pneumothorax. No acute osseous abnormality. IMPRESSION: Trace right pleural effusion Electronically Signed   By: Tish Frederickson M.D.   On: 12/31/2022 22:22    Procedures .Critical Care  Performed by: Netta Corrigan, PA-C Authorized by: Netta Corrigan, PA-C   Critical care provider statement:    Critical care time (minutes):  30   Critical care time was exclusive of:  Separately billable procedures and treating other patients   Critical care was necessary to treat or prevent imminent or life-threatening deterioration of the following conditions: new onset afib.   Critical care was time spent personally by me on the following activities:  Development of treatment plan with patient or surrogate, discussions with consultants, evaluation of patient's response to treatment, examination of patient, obtaining history from patient or surrogate, review of old charts, re-evaluation of patient's condition, pulse oximetry, ordering and review of radiographic studies, ordering and review of laboratory studies and ordering and performing treatments and interventions   I assumed direction of critical care for this patient from another provider in my specialty: no     Care discussed with: admitting provider       Medications Ordered in ED Medications  metoprolol tartrate (LOPRESSOR) injection 5 mg (5 mg Intravenous  Given 12/31/22 2149)    ED Course/ Medical Decision Making/ A&P Clinical Course as of 12/31/22 2259  Thu Dec 31, 2022  2202 Dr Daphine Deutscher from cardiology consulted.  Feels that patient can be admitted [place] and have cardiology see her in person tomorrow morning. [RP]    Clinical Course User Index [RP] Rondel Baton, MD         CHA2DS2-VASc Score: 4                        Medical Decision Making Amount and/or Complexity of Data Reviewed Labs: ordered. Radiology: ordered.   Janine Ores 82 y.o. presented today for shortness of breath.  Working DDx that I considered at this time includes, but not limited to, A-fib, asthma/COPD exacerbation, URI, viral illness, anemia, ACS, PE, pneumonia, pleural effusion, lung cancer.  R/o DDx: asthma/COPD exacerbation, URI, viral illness, anemia, ACS, PE, pneumonia, lung cancer: These are considered less likely due to history of present illness and physical exam findings  Review of prior external notes: 06/27/2015 H&P  Unique Tests and My Interpretation:  CBC: Unremarkable CMP: Unremarkable EKG: A-fib with RVR 131 bpm Troponin: 10 CXR: Vascular congestion with trace right pleural effusion BNP: Elevated 229  Discussion with Independent Historian:  Husband  Discussion of Management of Tests:  Daphine Deutscher, MD Cardiology; Carren Rang, MD Hospitalist  Risk: High: hospitalization or escalation of hospital-level care  Risk Stratification Score:  CHA2DS2-VASc: 4 HASBLED: 2  Staffed with Eloise Harman, MD  Plan: On exam patient was in no acute distress but was tachycardic at 129 in the room.  Patient does have nonpitting edema bilaterally with rhonchi noted in both lungs.  Patient's EKG does show new left bundle branch block along with a flutter with RVR.  5 mg Lopressor was ordered.  Labs ordered along with a chest x-ray.  Patient stable at this time.  Cardiology was consulted and stated that patient may be admitted due to new onset A-fib and  that cardiology can consult in the morning pending labs.  Patient's labs are reassuring and at this time patient has trace pleural effusions which would contribute to her shortness of breath.  Patient's heart rate also went down to the 80s and became sinus after seeing the Lopressor.  Patient stable for admission at this time and hospitalist will be consulted.  Hospitalist was consulted and patient was accepted for admission.         Final Clinical Impression(s) / ED Diagnoses Final diagnoses:  Shortness of breath  Atrial fibrillation, unspecified type (HCC)  Pleural effusion    Rx / DC Orders ED Discharge Orders          Ordered    Amb referral to AFIB Clinic        12/31/22 2147              Remi Deter 12/31/22 2300    Rondel Baton, MD 01/02/23 [phone]

## 2023-01-01 ENCOUNTER — Observation Stay (HOSPITAL_COMMUNITY): Payer: Medicare Other

## 2023-01-01 ENCOUNTER — Other Ambulatory Visit (HOSPITAL_COMMUNITY): Payer: Self-pay

## 2023-01-01 DIAGNOSIS — I5021 Acute systolic (congestive) heart failure: Secondary | ICD-10-CM | POA: Diagnosis not present

## 2023-01-01 DIAGNOSIS — I10 Essential (primary) hypertension: Secondary | ICD-10-CM

## 2023-01-01 DIAGNOSIS — Z833 Family history of diabetes mellitus: Secondary | ICD-10-CM | POA: Diagnosis not present

## 2023-01-01 DIAGNOSIS — K219 Gastro-esophageal reflux disease without esophagitis: Secondary | ICD-10-CM | POA: Diagnosis present

## 2023-01-01 DIAGNOSIS — E785 Hyperlipidemia, unspecified: Secondary | ICD-10-CM | POA: Insufficient documentation

## 2023-01-01 DIAGNOSIS — I110 Hypertensive heart disease with heart failure: Secondary | ICD-10-CM | POA: Diagnosis not present

## 2023-01-01 DIAGNOSIS — Z7982 Long term (current) use of aspirin: Secondary | ICD-10-CM | POA: Diagnosis not present

## 2023-01-01 DIAGNOSIS — G4733 Obstructive sleep apnea (adult) (pediatric): Secondary | ICD-10-CM | POA: Diagnosis not present

## 2023-01-01 DIAGNOSIS — Z79890 Hormone replacement therapy (postmenopausal): Secondary | ICD-10-CM | POA: Diagnosis not present

## 2023-01-01 DIAGNOSIS — I43 Cardiomyopathy in diseases classified elsewhere: Secondary | ICD-10-CM | POA: Diagnosis not present

## 2023-01-01 DIAGNOSIS — I4891 Unspecified atrial fibrillation: Secondary | ICD-10-CM

## 2023-01-01 DIAGNOSIS — I4892 Unspecified atrial flutter: Secondary | ICD-10-CM | POA: Diagnosis not present

## 2023-01-01 DIAGNOSIS — R54 Age-related physical debility: Secondary | ICD-10-CM | POA: Diagnosis not present

## 2023-01-01 DIAGNOSIS — N179 Acute kidney failure, unspecified: Secondary | ICD-10-CM | POA: Diagnosis not present

## 2023-01-01 DIAGNOSIS — E039 Hypothyroidism, unspecified: Secondary | ICD-10-CM

## 2023-01-01 DIAGNOSIS — Z85820 Personal history of malignant melanoma of skin: Secondary | ICD-10-CM | POA: Diagnosis not present

## 2023-01-01 DIAGNOSIS — Z79899 Other long term (current) drug therapy: Secondary | ICD-10-CM | POA: Diagnosis not present

## 2023-01-01 DIAGNOSIS — Z8249 Family history of ischemic heart disease and other diseases of the circulatory system: Secondary | ICD-10-CM | POA: Diagnosis not present

## 2023-01-01 DIAGNOSIS — Z888 Allergy status to other drugs, medicaments and biological substances status: Secondary | ICD-10-CM | POA: Diagnosis not present

## 2023-01-01 DIAGNOSIS — Z7901 Long term (current) use of anticoagulants: Secondary | ICD-10-CM | POA: Diagnosis not present

## 2023-01-01 DIAGNOSIS — Z800 Family history of malignant neoplasm of digestive organs: Secondary | ICD-10-CM | POA: Diagnosis not present

## 2023-01-01 DIAGNOSIS — I509 Heart failure, unspecified: Secondary | ICD-10-CM | POA: Diagnosis not present

## 2023-01-01 DIAGNOSIS — I428 Other cardiomyopathies: Secondary | ICD-10-CM | POA: Diagnosis not present

## 2023-01-01 DIAGNOSIS — R0602 Shortness of breath: Secondary | ICD-10-CM | POA: Diagnosis present

## 2023-01-01 DIAGNOSIS — R000 Tachycardia, unspecified: Secondary | ICD-10-CM | POA: Diagnosis not present

## 2023-01-01 DIAGNOSIS — Z87891 Personal history of nicotine dependence: Secondary | ICD-10-CM | POA: Diagnosis not present

## 2023-01-01 DIAGNOSIS — I447 Left bundle-branch block, unspecified: Secondary | ICD-10-CM | POA: Diagnosis present

## 2023-01-01 LAB — CBC WITH DIFFERENTIAL/PLATELET
Abs Immature Granulocytes: 0.01 10*3/uL (ref 0.00–0.07)
Basophils Absolute: 0.1 10*3/uL (ref 0.0–0.1)
Basophils Relative: 1 %
Eosinophils Absolute: 0.1 10*3/uL (ref 0.0–0.5)
Eosinophils Relative: 2 %
HCT: 34 % — ABNORMAL LOW (ref 36.0–46.0)
Hemoglobin: 10.8 g/dL — ABNORMAL LOW (ref 12.0–15.0)
Immature Granulocytes: 0 %
Lymphocytes Relative: 28 %
Lymphs Abs: 1.8 10*3/uL (ref 0.7–4.0)
MCH: 30.3 pg (ref 26.0–34.0)
MCHC: 31.8 g/dL (ref 30.0–36.0)
MCV: 95.2 fL (ref 80.0–100.0)
Monocytes Absolute: 0.5 10*3/uL (ref 0.1–1.0)
Monocytes Relative: 8 %
Neutro Abs: 4 10*3/uL (ref 1.7–7.7)
Neutrophils Relative %: 61 %
Platelets: 161 10*3/uL (ref 150–400)
RBC: 3.57 MIL/uL — ABNORMAL LOW (ref 3.87–5.11)
RDW: 13.2 % (ref 11.5–15.5)
WBC: 6.6 10*3/uL (ref 4.0–10.5)
nRBC: 0 % (ref 0.0–0.2)

## 2023-01-01 LAB — COMPREHENSIVE METABOLIC PANEL
ALT: 75 U/L — ABNORMAL HIGH (ref 0–44)
AST: 41 U/L (ref 15–41)
Albumin: 3.5 g/dL (ref 3.5–5.0)
Alkaline Phosphatase: 77 U/L (ref 38–126)
Anion gap: 7 (ref 5–15)
BUN: 26 mg/dL — ABNORMAL HIGH (ref 8–23)
CO2: 24 mmol/L (ref 22–32)
Calcium: 9.2 mg/dL (ref 8.9–10.3)
Chloride: 109 mmol/L (ref 98–111)
Creatinine, Ser: 0.87 mg/dL (ref 0.44–1.00)
GFR, Estimated: >60 mL/min (ref >60)
Glucose, Bld: 105 mg/dL — ABNORMAL HIGH (ref 70–99)
Potassium: 4.1 mmol/L (ref 3.5–5.1)
Sodium: 140 mmol/L (ref 135–145)
Total Bilirubin: 1 mg/dL (ref 0.3–1.2)
Total Protein: 6.5 g/dL (ref 6.5–8.1)

## 2023-01-01 LAB — MAGNESIUM: Magnesium: 2 mg/dL (ref 1.7–2.4)

## 2023-01-01 MED ORDER — METOPROLOL TARTRATE 25 MG PO TABS
25.0000 mg | ORAL_TABLET | Freq: Two times a day (BID) | ORAL | Status: DC
  Administered 2023-01-01 – 2023-01-03 (×5): 25 mg via ORAL
  Filled 2023-01-01 (×5): qty 1

## 2023-01-01 MED ORDER — APIXABAN 5 MG PO TABS
5.0000 mg | ORAL_TABLET | Freq: Two times a day (BID) | ORAL | Status: DC
  Administered 2023-01-01 – 2023-01-03 (×4): 5 mg via ORAL
  Filled 2023-01-01 (×4): qty 1

## 2023-01-01 MED ORDER — OXYCODONE HCL 5 MG PO TABS: 5.0000 mg | ORAL_TABLET | ORAL | Status: DC | PRN

## 2023-01-01 MED ORDER — LEVOTHYROXINE SODIUM 88 MCG PO TABS
88.0000 ug | ORAL_TABLET | Freq: Every day | ORAL | Status: DC
  Administered 2023-01-01 – 2023-01-04 (×4): 88 ug via ORAL
  Filled 2023-01-01 (×4): qty 1

## 2023-01-01 MED ORDER — PANTOPRAZOLE SODIUM 40 MG IV SOLR
40.0000 mg | Freq: Two times a day (BID) | INTRAVENOUS | Status: DC
  Administered 2023-01-01 – 2023-01-02 (×3): 40 mg via INTRAVENOUS
  Filled 2023-01-01 (×3): qty 10

## 2023-01-01 MED ORDER — FUROSEMIDE 10 MG/ML IJ SOLN
20.0000 mg | Freq: Once | INTRAMUSCULAR | Status: AC
  Administered 2023-01-01: 20 mg via INTRAVENOUS
  Filled 2023-01-01: qty 2

## 2023-01-01 MED ORDER — ACETAMINOPHEN 325 MG PO TABS: 650.0000 mg | ORAL_TABLET | Freq: Four times a day (QID) | ORAL | Status: DC | PRN

## 2023-01-01 MED ORDER — FUROSEMIDE 10 MG/ML IJ SOLN
20.0000 mg | Freq: Two times a day (BID) | INTRAMUSCULAR | Status: DC
  Administered 2023-01-01 – 2023-01-02 (×2): 20 mg via INTRAVENOUS
  Filled 2023-01-01 (×3): qty 2

## 2023-01-01 MED ORDER — ONDANSETRON HCL 4 MG PO TABS: 4.0000 mg | ORAL_TABLET | Freq: Four times a day (QID) | ORAL | Status: DC | PRN

## 2023-01-01 MED ORDER — FUROSEMIDE 10 MG/ML IJ SOLN
40.0000 mg | Freq: Once | INTRAMUSCULAR | Status: AC
  Administered 2023-01-01: 40 mg via INTRAVENOUS
  Filled 2023-01-01: qty 4

## 2023-01-01 MED ORDER — ALPRAZOLAM 0.25 MG PO TABS
0.2500 mg | ORAL_TABLET | Freq: Every evening | ORAL | Status: DC | PRN
  Administered 2023-01-01 – 2023-01-02 (×2): 0.25 mg via ORAL
  Filled 2023-01-01 (×2): qty 1

## 2023-01-01 MED ORDER — ONDANSETRON HCL 4 MG/2ML IJ SOLN: 4.0000 mg | Freq: Four times a day (QID) | INTRAMUSCULAR | Status: DC | PRN

## 2023-01-01 MED ORDER — MORPHINE SULFATE (PF) 2 MG/ML IV SOLN: 2.0000 mg | INTRAVENOUS | Status: DC | PRN

## 2023-01-01 MED ORDER — ACETAMINOPHEN 650 MG RE SUPP: 650.0000 mg | Freq: Four times a day (QID) | RECTAL | Status: DC | PRN

## 2023-01-01 MED ORDER — LISINOPRIL 10 MG PO TABS
10.0000 mg | ORAL_TABLET | Freq: Every day | ORAL | Status: DC
  Administered 2023-01-01: 10 mg via ORAL
  Filled 2023-01-01: qty 1

## 2023-01-01 MED ORDER — ROSUVASTATIN CALCIUM 10 MG PO TABS
10.0000 mg | ORAL_TABLET | Freq: Every day | ORAL | Status: DC
  Administered 2023-01-01 – 2023-01-04 (×4): 10 mg via ORAL
  Filled 2023-01-01 (×4): qty 1

## 2023-01-01 MED ORDER — ASPIRIN 81 MG PO TBEC
81.0000 mg | DELAYED_RELEASE_TABLET | Freq: Every day | ORAL | Status: DC
  Administered 2023-01-01 – 2023-01-03 (×3): 81 mg via ORAL
  Filled 2023-01-01 (×3): qty 1

## 2023-01-01 NOTE — Discharge Instructions (Signed)

## 2023-01-01 NOTE — H&P (Signed)
History and Physical    Patient: Kaylee Gilbert MWN:[id number] DOB: Apr 13, 1941 DOA: 12/31/2022 DOS: the patient was seen and examined on 01/01/2023 PCP: Kaylee Chimera, MD  Patient coming from: Home  Chief Complaint:  Chief Complaint  Patient presents with   Shortness of Breath   HPI: Kaylee Gilbert is a 82 y.o. female with medical history significant of hypertension, hypothyroidism, hyperlipidemia, GERD, presents to ED with a chief complaint of dyspnea.  Patient reports that the first of the week she was feeling general fatigue and malaise.  She did not want to go to her physical therapy for her shoulder because she did not feel well.  Tuesday was no better.  Wednesday symptoms continued so she made appointment with her PCP on Thursday.  PCP sent her to the ER due to rapid heart rate.  Patient reports she notices that she has been easily fatigued.  On further questioning she describes being easily fatigued as dyspneic.  Is worse on exertion.  Its better with rest.  Been going on for about 2 weeks.  She denies any chest pain or palpitations.  She has had some peripheral edema.  She did not notice a peripheral edema until yesterday.  Patient denies any orthopnea or insomnia.  She reports she is on a thyroid pill, which she has been on since she was in her 9s.  She has not had any recent changes to her dosage.  She has no history of irregular heartbeat.  She does report that her mother had congenital valvular heart pathology.  She has no personal history of valvular disease.  Patient has no other complaints at this time.  Patient is full code reporting that she never been asked, and she would need to think about it, but that she is a good [place] and she knows where she will go when she dies.  Review of Systems: As mentioned in the history of present illness. All other systems reviewed and are negative. Past Medical History:  Diagnosis Date   Hypertension    requires no med, watches diet-pt  states it is "white coat syndrome, it's never high at home, usually 120/60"   Hypothyroid    Melanoma of buttock (HCC) 04/2013   2 sites removed by MD   Past Surgical History:  Procedure Laterality Date   ABDOMINAL HYSTERECTOMY     CATARACT EXTRACTION W/PHACO Right 10/24/2013   Procedure: CATARACT EXTRACTION PHACO AND INTRAOCULAR LENS PLACEMENT (IOC);  Surgeon: Loraine Leriche T. Nile Riggs, MD;  Location: [facility];  Service: Ophthalmology;  Laterality: Right;  CDE:  5.57   CATARACT EXTRACTION W/PHACO Left 06/27/2015   Procedure: CATARACT EXTRACTION PHACO AND INTRAOCULAR LENS PLACEMENT LEFT EYE;  Surgeon: Gemma Payor, MD;  Location: [facility];  Service: Ophthalmology;  Laterality: Left;  CDE 8.27   CHOLECYSTECTOMY  around 2010   COLONOSCOPY N/A 05/17/2013   Procedure: COLONOSCOPY;  Surgeon: Malissa Hippo, MD;  Location: [facility] SUITE;  Service: Endoscopy;  Laterality: N/A;  730   complete hysterectomy     2004, fibroids, prolapse   KNEE ARTHROSCOPY     RT 1998   TONSILLECTOMY     Social History:  reports that she quit smoking about 59 years ago. Her smoking use included cigarettes. She started smoking about 69 years ago. She has a 2.5 pack-year smoking history. She does not have any smokeless tobacco history on file. She reports that she does not drink alcohol and does not use drugs.  Allergies  Allergen Reactions  Metaxalone Other (See Comments)    Family History  Problem Relation Age of Onset   Congestive Heart Failure Mother    Cancer - Colon Father    Diabetes type II Brother     Prior to Admission medications   Medication Sig Start Date End Date Taking? Authorizing Provider  aspirin EC 81 MG tablet Take 81 mg by mouth daily.    [provider]  cetirizine (ZYRTEC) 10 MG tablet Take 10 mg by mouth daily as needed for allergies.    [provider]  levothyroxine (SYNTHROID, LEVOTHROID) 75 MCG tablet Take 75 mcg by mouth daily before breakfast.    [provider]   Multiple Vitamins-Minerals (HAIR/SKIN/NAILS PO) Take 1 tablet by mouth daily as needed (brittle nails).    [provider]    Physical Exam: Vitals:   12/31/22 2315 12/31/22 2330 01/01/23 0042 01/01/23 0409  BP: (!) 140/76 130/86 (!) 146/86 (!) 155/83  Pulse: 72 94 (!) 102 96  Resp: (!) 23 (!) 25 20 20   Temp:   98.3 F (36.8 C) 98.3 F (36.8 C)  TempSrc:   Oral Oral  SpO2: 92%  95% 93%  Weight:      Height:       1.  General: Patient lying supine in bed,  no acute distress   2. Psychiatric: Alert and oriented x 3, mood and behavior normal for situation, pleasant and cooperative with exam   3. Neurologic: Speech and language are normal, face is symmetric, moves all 4 extremities voluntarily, at baseline without acute deficits on limited exam   4. HEENMT:  Head is atraumatic, normocephalic, pupils reactive to light, neck is supple, trachea is midline, mucous membranes are moist   5. Respiratory : Lungs are clear to auscultation bilaterally without wheezing, rhonchi, rales, no cyanosis, no increase in work of breathing or accessory muscle use   6. Cardiovascular : Heart rate tachycardic, rhythm is irregularly irregular, no murmurs, rubs or gallops, pitting, peripheral edema, peripheral pulses palpated   7. Gastrointestinal:  Abdomen is soft, nondistended, nontender to palpation bowel sounds active, no masses or organomegaly palpated   8. Skin:  Skin is warm, dry and intact without rashes, acute lesions, or ulcers on limited exam   9.Musculoskeletal:  No acute deformities or trauma, no asymmetry in tone, pitting, peripheral edema, peripheral pulses palpated, no tenderness to palpation in the extremities  Data Reviewed: In the ED Patient is afebrile, heart rate ranging from 74-143, respiratory rate 16-24, blood pressure 125/71-141/92, maintaining oxygen sats on room air No leukocytosis, hemoglobin 11.1 Chemistry unremarkable BNP 229 Troponin 10, 10 Chest x-ray  shows trace right pleural effusion EKG shows A-fib RVR with a heart rate of 131 QTc 522 Lopressor 5 mg IV given in the ED  Assessment and Plan: * Atrial fibrillation with RVR (HCC) - No history of atrial fibrillation - Heart rate initially 143, improved with Lopressor 5 mg IV - CHA2DS2-VASc 4 - Started on heparin - Troponin 10, 10 - Echo in the a.m. - Some peripheral edema, likely mild heart failure secondary to RVR, 1 dose Lasix - Consult to cardiology - Monitor on telemetry  HLD (hyperlipidemia) - Continue Crestor  Essential hypertension, benign - Continue lisinopril  Hypothyroidism - Continue Synthroid - TSH pending      Advance Care Planning:   Code Status: Full Code  Consults: Cardiology  Family Communication: No family at bedside  Severity of Illness: The appropriate patient status for this patient is OBSERVATION. Observation  status is judged to be reasonable and necessary in order to provide the required intensity of service to ensure the patient's safety. The patient's presenting symptoms, physical exam findings, and initial radiographic and laboratory data in the context of their medical condition is felt to place them at decreased risk for further clinical deterioration. Furthermore, it is anticipated that the patient will be medically stable for discharge from the hospital within 2 midnights of admission.   Author: Lilyan Gilford, DO 01/01/2023 5:55 AM  For on call review [email or web address].

## 2023-01-01 NOTE — Progress Notes (Signed)
   01/01/23 1355  TOC Brief Assessment  Insurance and Status Reviewed  Patient has primary care physician Yes  Home environment has been reviewed with spouse  Prior level of function: assistance  Prior/Current Home Services No current home services  Social Determinants of Health Reivew SDOH reviewed no interventions necessary  Readmission risk has been reviewed Yes  Transition of care needs no transition of care needs at this time   [facility] Screen Transition of Care Department [facility]) has reviewed patient and no TOC needs have been identified at this time. We will continue to monitor patient advancement through interdisciplinary progression rounds. If new patient transition needs arise, please place a TOC consult.

## 2023-01-01 NOTE — Progress Notes (Signed)
PROGRESS NOTE   Kaylee Gilbert  ZOX:[id number] DOB: 1941/03/03 DOA: 12/31/2022 PCP: Richardean Chimera, MD   Chief Complaint  Patient presents with   Shortness of Breath   Level of care: Telemetry  Brief Admission History:  82 y.o. female with medical history significant of hypertension, hypothyroidism, hyperlipidemia, GERD, presents to ED with a chief complaint of dyspnea.  Patient reports that the first of the week she was feeling general fatigue and malaise.  She did not want to go to her physical therapy for her shoulder because she did not feel well.  Tuesday was no better.  Wednesday symptoms continued so she made appointment with her PCP on Thursday.  PCP sent her to the ER due to rapid heart rate.  Patient reports she notices that she has been easily fatigued.  On further questioning she describes being easily fatigued as dyspneic.  Is worse on exertion.  Its better with rest.  Been going on for about 2 weeks.  She denies any chest pain or palpitations.  She has had some peripheral edema.  She did not notice a peripheral edema until yesterday.  Patient denies any orthopnea or insomnia.  She reports she is on a thyroid pill, which she has been on since she was in her 19s.  She has not had any recent changes to her dosage.  She has no history of irregular heartbeat.  She does report that her mother had congenital valvular heart pathology.  She has no personal history of valvular disease.  Patient has no other complaints at this time.  She has reduced LVEF and cardiology planning to do TEE/cardioversion on 01/04/23 after further diuresis over next couple of days.      Assessment and Plan:  Atrial fibrillation with RVR  - No prior history of atrial fibrillation - Heart rate initially 143, improved with Lopressor 5 mg IV - CHA2DS2-VASc 4 - Started on heparin and transitioning to apixaban per cardiology recommendation  - Troponin 10, 10 reassuring  - Echo with reduced LVEF 25-30% - discussed  with Dr. Tenny Craw, recommendation is for TEE/cardioversion on 01/04/23 - continue current treatment  Acute HFrEF - tachycardia induced - continue IV lasix diuresis  - cardiology team planning TEE/cardioversion on 01/04/23   HLD (hyperlipidemia) - Continue Crestor  Essential hypertension, benign - Continue lisinopril  Hypothyroidism - Continue Synthroid - TSH 2.410 within normal limits   DVT prophylaxis: transitioned to apixaban  Code Status: Full  Family Communication:     Consultants:  cardiology Procedures:  TEE/cardioversion planned for 01/04/23   Antimicrobials:    Subjective: Pt is feeling better as she is being diuresed.    Objective: Vitals:   01/01/23 0409 01/01/23 1202 01/01/23 1300 01/01/23 1409  BP: (!) 155/83   135/63  Pulse: 96 (!) 115 92 72  Resp: 20   18  Temp: 98.3 F (36.8 C)   98 F (36.7 C)  TempSrc: Oral     SpO2: 93%   95%  Weight:      Height:        Intake/Output Summary (Last 24 hours) at 01/01/2023 1504 Last data filed at 01/01/2023 0900 Gross per 24 hour  Intake 398.14 ml  Output --  Net 398.14 ml   Filed Weights   12/31/22 2101  Weight: 82.6 kg   Examination:  General exam: frail elderly female, cooperative. Pleasant. NAD. Appears calm and comfortable  Respiratory system: Clear to auscultation. Respiratory effort normal. Cardiovascular system: irregularly irregular, normal S1 &  S2 heard. Mild JVD. 1+ pedal edema. Gastrointestinal system: Abdomen is nondistended, soft and nontender. No organomegaly or masses felt. Normal bowel sounds heard. Central nervous system: Alert and oriented. No focal neurological deficits. Extremities: Symmetric 5 x 5 power. Skin: No rashes, lesions or ulcers. Psychiatry: Judgement and insight appear normal. Mood & affect appropriate.   Data Reviewed: I have personally reviewed following labs and imaging studies  CBC: Recent Labs  Lab 12/31/22 2113 01/01/23 0427  WBC 6.5 6.6  NEUTROABS 4.1 4.0  HGB  11.1* 10.8*  HCT 35.0* 34.0*  MCV 95.4 95.2  PLT 171 161    Basic Metabolic Panel: Recent Labs  Lab 12/31/22 2113 01/01/23 0427  NA 139 140  K 4.5 4.1  CL 109 109  CO2 24 24  GLUCOSE 120* 105*  BUN 30* 26*  CREATININE 1.01* 0.87  CALCIUM 9.2 9.2  MG  --  2.0    CBG: No results for input(s): "GLUCAP" in the last 168 hours.  No results found for this or any previous visit (from the past 240 hour(s)).   Radiology Studies: ECHOCARDIOGRAM COMPLETE  Result Date: 01/01/2023    ECHOCARDIOGRAM REPORT   Patient Name:   Kaylee Gilbert Date of Exam: 01/01/2023 Medical Rec #:  [id number]        Height:       64.0 in Accession #:    [id number]       Weight:       182.0 lb Date of Birth:  Jul 16, 1940        BSA:          1.880 m Patient Age:    82 years         BP:           141/71 mmHg Patient Gender: F                HR:           120 bpm. [facility] Procedure: 2D Echo, Color Doppler and Cardiac Doppler Indications:    I48.91* Unspeicified atrial fibrillation  History:        Patient has no prior history of Echocardiogram examinations.                 Arrythmias:Atrial Fibrillation; Risk Factors:Hypertension.  Sonographer:    Darlys Gales Referring Phys: [id number] ASIA B ZIERLE-GHOSH IMPRESSIONS  1. LVEF is depressed with global hypokinesis worse in the inferior, septal and apical walls . Left ventricular ejection fraction, by estimation, is 25 to 30%. The left ventricle has severely decreased function. There is mild left ventricular hypertrophy. Left ventricular diastolic parameters are indeterminate.  2. Right ventricular systolic function is moderately reduced. The right ventricular size is normal.  3. The mitral valve is normal in structure. Trivial mitral valve regurgitation.  4. The aortic valve is normal in structure. Aortic valve regurgitation is not visualized.  5. The inferior vena cava is dilated in size with <50% respiratory variability, suggesting right atrial pressure of 15  mmHg. FINDINGS  Left Ventricle: LVEF is depressed with global hypokinesis worse in the inferior, septal and apical walls. Left ventricular ejection fraction, by estimation, is 25 to 30%. The left ventricle has severely decreased function. The left ventricular internal cavity size was normal in size. There is mild left ventricular hypertrophy. Left ventricular diastolic parameters are indeterminate. Right Ventricle: The right ventricular size is normal. Right vetricular wall thickness was not assessed. Right ventricular systolic function is moderately reduced.  Left Atrium: Left atrial size was normal in size. Right Atrium: Right atrial size was normal in size. Pericardium: Trivial pericardial effusion is present. Mitral Valve: The mitral valve is normal in structure. Trivial mitral valve regurgitation. Tricuspid Valve: The tricuspid valve is normal in structure. Tricuspid valve regurgitation is mild. Aortic Valve: The aortic valve is normal in structure. Aortic valve regurgitation is not visualized. Pulmonic Valve: The pulmonic valve was not well visualized. Pulmonic valve regurgitation is not visualized. Aorta: The aortic root is normal in size and structure. Venous: The inferior vena cava is dilated in size with less than 50% respiratory variability, suggesting right atrial pressure of 15 mmHg. IAS/Shunts: No atrial level shunt detected by color flow Doppler.  LEFT VENTRICLE PLAX 2D LVIDd:         4.40 cm LVIDs:         3.30 cm LV PW:         1.00 cm LV IVS:        1.30 cm LVOT diam:     1.90 cm LVOT Area:     2.84 cm  RIGHT VENTRICLE         IVC TAPSE (M-mode): 1.5 cm  IVC diam: 2.60 cm LEFT ATRIUM             Index        RIGHT ATRIUM           Index LA Vol (A2C):   38.7 ml 20.59 ml/m  RA Area:     13.30 cm LA Vol (A4C):   44.2 ml 23.52 ml/m  RA Volume:   28.60 ml  15.22 ml/m LA Biplane Vol: 45.6 ml 24.26 ml/m   AORTA Ao Root diam: 2.80 cm TRICUSPID VALVE TR Peak grad:   29.2 mmHg TR Vmax:        270.00  cm/s  SHUNTS Systemic Diam: 1.90 cm Dietrich Pates MD Electronically signed by Dietrich Pates MD Signature Date/Time: 01/01/2023/2:08:47 PM    Final    DG Chest 2 View  Result Date: 12/31/2022 CLINICAL DATA:  shob EXAM: CHEST - 2 VIEW.  Patient is rotated. COMPARISON:  None Available. FINDINGS: The heart and mediastinal contours are within normal limits. Biapical pleural/pulmonary scarring. Right base atelectasis. No pulmonary edema. Trace right pleural effusion. No pneumothorax. No acute osseous abnormality. IMPRESSION: Trace right pleural effusion Electronically Signed   By: Tish Frederickson M.D.   On: 12/31/2022 22:22    Scheduled Meds:  apixaban  5 mg Oral BID   aspirin EC  81 mg Oral Daily   furosemide  20 mg Intravenous BID   furosemide  40 mg Intravenous Once   levothyroxine  88 mcg Oral QAC breakfast   metoprolol tartrate  25 mg Oral BID   pantoprazole (PROTONIX) IV  40 mg Intravenous Q12H   rosuvastatin  10 mg Oral Daily   Continuous Infusions:   LOS: 0 days   Time spent: 35 mins  Riku Buttery Laural Benes, MD How to contact the [facility] Attending or Consulting provider 7A - 7P or covering provider during after hours 7P -7A, for this patient?  Check the care team in [facility] and look for a) attending/consulting [facility] provider listed and b) the [facility] team listed Log into [email or web address] and use [place]'s universal password to access. If you do not have the password, please contact the hospital operator. Locate the [facility] provider you are looking for under [facility] and page to a number that you can be directly reached. If you  still have difficulty reaching the provider, please page the [facility] (Director on Call) for the Hospitalists listed on amion for assistance.  01/01/2023, 3:04 PM

## 2023-01-01 NOTE — Hospital Course (Signed)
82 y.o. female with medical history significant of hypertension, hypothyroidism, hyperlipidemia, GERD, presents to ED with a chief complaint of dyspnea.  Patient reports that the first of the week she was feeling general fatigue and malaise.  She did not want to go to her physical therapy for her shoulder because she did not feel well.  Tuesday was no better.  Wednesday symptoms continued so she made appointment with her PCP on Thursday.  PCP sent her to the ER due to rapid heart rate.  Patient reports she notices that she has been easily fatigued.  On further questioning she describes being easily fatigued as dyspneic.  Is worse on exertion.  Its better with rest.  Been going on for about 2 weeks.  She denies any chest pain or palpitations.  She has had some peripheral edema.  She did not notice a peripheral edema until yesterday.  Patient denies any orthopnea or insomnia.  She reports she is on a thyroid pill, which she has been on since she was in her 31s.  She has not had any recent changes to her dosage.  She has no history of irregular heartbeat.  She does report that her mother had congenital valvular heart pathology.  She has no personal history of valvular disease.  Patient has no other complaints at this time.  She has reduced LVEF and cardiology planning to do TEE/cardioversion on 01/04/23 after further diuresis over next couple of days.

## 2023-01-01 NOTE — Plan of Care (Signed)

## 2023-01-01 NOTE — Progress Notes (Signed)
Will recheck patients HR has been up and then will go back into the 90's just administered metoprolol PO .    01/01/23 1202  Assess: MEWS Score  Pulse Rate (!) 115  Assess: MEWS Score  MEWS Temp 0  MEWS Systolic 0  MEWS Pulse 2  MEWS RR 0  MEWS LOC 0  MEWS Score 2  MEWS Score Color Yellow  Assess: if the MEWS score is Yellow or Red  Were vital signs accurate and taken at a resting state? Yes  Does the patient meet 2 or more of the SIRS criteria? No  MEWS guidelines implemented  Yes, yellow  Treat  MEWS Interventions Considered administering scheduled or prn medications/treatments as ordered  Take Vital Signs  Increase Vital Sign Frequency  Yellow: Q2hr x1, continue Q4hrs until patient remains green for 12hrs  Escalate  MEWS: Escalate Yellow: Discuss with charge nurse and consider notifying provider and/or RRT  Notify: Charge Nurse/RN  Name of Charge Nurse/RN Notified Crystal , RN  Provider Notification  Provider Name/Title Dr. Laural Benes  Date Provider Notified 01/01/23  Time Provider Notified 1258  Assess: SIRS CRITERIA  SIRS Temperature  0  SIRS Pulse 1  SIRS Respirations  0  SIRS WBC 0  SIRS Score Sum  1

## 2023-01-01 NOTE — TOC Benefit Eligibility Note (Addendum)
Pharmacy Patient Advocate Encounter  Insurance verification completed.    The patient is insured through [facility] Medicare Part D  Ran test claim for Eliquis 5 mg and the current 30 day co-pay is $47.00.  Ran test claim for Entresto 24-26 mg and the current 30 day co-pay is $47.00.  Ran test claim for Farxiga 10 mg and the current 30 day co-pay is $47.00.  Ran test claim for Jardiance 10 mg and the current 30 day co-pay is $47.00.   This test claim was processed through [facility]- copay amounts may vary at other pharmacies due to pharmacy/plan contracts, or as the patient moves through the different stages of their insurance plan.    Roland Earl, CPHT Pharmacy Patient Advocate Specialist [facility] Pharm[facility] Patient Advocate Team Direct Number: [phone]  Fax: [phone]

## 2023-01-01 NOTE — Assessment & Plan Note (Signed)
-   No history of atrial fibrillation - Heart rate initially 143, improved with Lopressor 5 mg IV - CHA2DS2-VASc 4 - Started on heparin - Troponin 10, 10 - Echo in the a.m. - Some peripheral edema, likely mild heart failure secondary to RVR, 1 dose Lasix - Consult to cardiology - Monitor on telemetry

## 2023-01-01 NOTE — Assessment & Plan Note (Signed)
Continue Crestor 

## 2023-01-01 NOTE — Progress Notes (Signed)
HR 92 from 115 .,    01/01/23 1300  Assess: MEWS Score  Pulse Rate 92  Level of Consciousness Alert  Assess: MEWS Score  MEWS Temp 0  MEWS Systolic 0  MEWS Pulse 0  MEWS RR 0  MEWS LOC 0  MEWS Score 0  MEWS Score Color Green  Assess: SIRS CRITERIA  SIRS Temperature  0  SIRS Pulse 1  SIRS Respirations  0  SIRS WBC 0  SIRS Score Sum  1

## 2023-01-01 NOTE — Assessment & Plan Note (Signed)
Continue lisinopril

## 2023-01-01 NOTE — Progress Notes (Signed)
Patient ambulatory this shift, able to make needs known, Heparin stopped and started on PO eliquis, patient educated about risks for bleeding and bruising. Patient verbalized understanding of teaching, no c/o pain or discomfort , plan of care to be NPO on Sunday for TEE on Monday morning. Patient in atrial fib during periods, after metoprolol HR has been more controlled in the 70's .

## 2023-01-01 NOTE — Assessment & Plan Note (Signed)
-   Continue Synthroid - TSH pending

## 2023-01-01 NOTE — Plan of Care (Signed)

## 2023-01-01 NOTE — Consult Note (Addendum)
Cardiology Consultation   Patient ID: MEGAN PRESTI MRN: [id number]; DOB: [birth year]  Admit date: 12/31/2022 Date of Consult: 01/01/2023  PCP:  Richardean Chimera, MD   [facility] Providers Cardiologist: New to [facility]  Patient Profile:   TARSHIA KOT is a 82 y.o. female with a hx of HTN and hypothyroidism who is being seen 01/01/2023 for the evaluation of new-onset atrial fibrillation at the request of Dr. Carren Rang.  History of Present Illness:   Ms. Endicott presented to [facility], ED on 12/31/2022 after being evaluated by her PCP and found to be tachycardic with heart rate in the 130's to 140's. She also reported worsening lower extremity edema with a 10 pound weight gain within the past 2 weeks.  Initial labs showed WBC 6.5, Hgb 11.1, platelets 171, Na+ 139, K+ 4.5 and creatinine 1.01.  AST 46 and ALT 83. Initial and repeat troponin values have been negative at 10.  BNP elevated to 229. TSH normal at 2.410. CXR showing trace right pleural effusion. EKG showing atrial fibrillation with RVR, HR 131 with bundle branch block and ST depression along the inferior and lateral leads.   She received IV Lopressor 5 mg while in the ED but it does not appear that AV nodal blocking agents were ordered at the time of admission. Received 1 dose of IV Lasix 20 mg this morning.  In talking with the patient and her son today, she reports she is usually very active at baseline and enjoys gardening around her home. Says that over the past month, she has started to notice some shortness of breath with activity which was new. Over the past week, this has acutely progressed and she reports shortness of breath with climbing up 4 steps which is new. Reports worsening fatigue as well. No specific chest pain or palpitations. No recent orthopnea or PND. She did notice lower extremity edema this week and reports a 7 to 8 pound weight gain on her home scales despite no changes in her dietary  intake.  Past Medical History:  Diagnosis Date   Hypertension    requires no med, watches diet-pt states it is "white coat syndrome, it's never high at home, usually 120/60"   Hypothyroid    Melanoma of buttock (HCC) 04/2013   2 sites removed by MD    Past Surgical History:  Procedure Laterality Date   ABDOMINAL HYSTERECTOMY     CATARACT EXTRACTION W/PHACO Right 10/24/2013   Procedure: CATARACT EXTRACTION PHACO AND INTRAOCULAR LENS PLACEMENT (IOC);  Surgeon: Loraine Leriche T. Nile Riggs, MD;  Location: [facility];  Service: Ophthalmology;  Laterality: Right;  CDE:  5.57   CATARACT EXTRACTION W/PHACO Left 06/27/2015   Procedure: CATARACT EXTRACTION PHACO AND INTRAOCULAR LENS PLACEMENT LEFT EYE;  Surgeon: Gemma Payor, MD;  Location: [facility];  Service: Ophthalmology;  Laterality: Left;  CDE 8.27   CHOLECYSTECTOMY  around 2010   COLONOSCOPY N/A 05/17/2013   Procedure: COLONOSCOPY;  Surgeon: Malissa Hippo, MD;  Location: [facility] SUITE;  Service: Endoscopy;  Laterality: N/A;  730   complete hysterectomy     2004, fibroids, prolapse   KNEE ARTHROSCOPY     RT 1998   TONSILLECTOMY       Home Medications:  Prior to Admission medications   Medication Sig Start Date End Date Taking? Authorizing Provider  acetaminophen (TYLENOL) 325 MG tablet Take 650 mg by mouth every 6 (six) hours as needed for headache.   Yes [provider]  cetirizine (ZYRTEC)  10 MG tablet Take 10 mg by mouth daily as needed for allergies.   Yes [provider]  levothyroxine (SYNTHROID) 88 MCG tablet Take 1 tablet by mouth daily before breakfast.   Yes [provider]  losartan (COZAAR) 100 MG tablet Take 100 mg by mouth daily.   Yes [provider]  Multiple Vitamins-Minerals (HAIR/SKIN/NAILS PO) Take 1 tablet by mouth daily as needed (brittle nails).   Yes [provider]  omeprazole (PRILOSEC) 20 MG capsule Take 20 mg by mouth daily. 04/28/19  Yes [provider]  rosuvastatin  (CRESTOR) 5 MG tablet Take 5 mg by mouth daily. 08/23/22  Yes [provider]    Inpatient Medications: Scheduled Meds:  aspirin EC  81 mg Oral Daily   furosemide  20 mg Intravenous BID   levothyroxine  88 mcg Oral QAC breakfast   metoprolol tartrate  25 mg Oral BID   pantoprazole (PROTONIX) IV  40 mg Intravenous Q12H   rosuvastatin  10 mg Oral Daily   Continuous Infusions:  heparin 850 Units/hr (01/01/23 1035)   PRN Meds: acetaminophen **OR** acetaminophen, ALPRAZolam, morphine injection, ondansetron **OR** ondansetron (ZOFRAN) IV, oxyCODONE  Allergies:    Allergies  Allergen Reactions   Metaxalone Other (See Comments)    Social History:   Social History   Socioeconomic History   Marital status: Married    Spouse name: Not on file   Number of children: Not on file   Years of education: Not on file   Highest education level: Not on file  Occupational History   Not on file  Tobacco Use   Smoking status: Former    Current packs/day: 0.00    Average packs/day: 0.3 packs/day for 10.0 years (2.5 ttl pk-yrs)    Types: Cigarettes    Start date: 06/01/1953    Quit date: 06/02/1963    Years since quitting: 59.6   Smokeless tobacco: Not on file  Substance and Sexual Activity   Alcohol use: No   Drug use: No   Sexual activity: Not on file  Other Topics Concern   Not on file  Social History Narrative   Not on file   Social Determinants of Health   Financial Resource Strain: Not on file  Food Insecurity: No Food Insecurity (01/01/2023)   Hunger Vital Sign    Worried About Running Out of Food in the Last Year: Never true    Ran Out of Food in the Last Year: Never true  Transportation Needs: No Transportation Needs (01/01/2023)   PRAPARE - Administrator, Civil Service (Medical): No    Lack of Transportation (Non-Medical): No  Physical Activity: Not on file  Stress: Not on file  Social Connections: Not on file  Intimate Partner Violence: Not At Risk  (01/01/2023)   Humiliation, Afraid, Rape, and Kick questionnaire    Fear of Current or Ex-Partner: No    Emotionally Abused: No    Physically Abused: No    Sexually Abused: No    Family History:    Family History  Problem Relation Age of Onset   Congestive Heart Failure Mother    Cancer - Colon Father    Diabetes type II Brother      ROS:  Please see the history of present illness.   All other ROS reviewed and negative.     Physical Exam/Data:   Vitals:   12/31/22 2330 01/01/23 0042 01/01/23 0409 01/01/23 1202  BP: 130/86 (!) 146/86 (!) 155/83  Pulse: 94 (!) 102 96 (!) 115  Resp: (!) 25 20 20    Temp:  98.3 F (36.8 C) 98.3 F (36.8 C)   TempSrc:  Oral Oral   SpO2:  95% 93%   Weight:      Height:        Intake/Output Summary (Last 24 hours) at 01/01/2023 1213 Last data filed at 01/01/2023 0900 Gross per 24 hour  Intake 398.14 ml  Output --  Net 398.14 ml      12/31/2022    9:01 PM 06/27/2015    6:33 AM 06/24/2015   10:12 AM  Last 3 Weights  Weight (lbs) 182 lb 181 lb 181 lb  Weight (kg) 82.555 kg 82.101 kg 82.101 kg     Body mass index is 31.24 kg/m.  General:  Well nourished, well developed, in no acute distress HEENT: normal Neck: no JVD Vascular: No carotid bruits; Distal pulses 2+ bilaterally Cardiac:  normal S1, S2; Irregularly irregular. 2/6 systolic murmur along RUSB.  Lungs: rales along right base. No wheezing.  Abd: soft, nontender, no hepatomegaly  Ext: trace lower extremity edema Musculoskeletal:  No deformities, BUE and BLE strength normal and equal Skin: warm and dry  Neuro:  CNs 2-12 intact, no focal abnormalities noted Psych:  Normal affect   Telemetry:  Telemetry was personally reviewed and demonstrates: Atrial fibrillation, heart rate in 110s to 120s with peak heart rate in the 130s.  Occasional PVCs.  Relevant CV Studies:  Echocardiogram: Pending  Laboratory Data:  High Sensitivity Troponin:   Recent Labs  Lab 12/31/22 2113  12/31/22 2304  TROPONINIHS 10 10     Chemistry Recent Labs  Lab 12/31/22 2113 01/01/23 0427  NA 139 140  K 4.5 4.1  CL 109 109  CO2 24 24  GLUCOSE 120* 105*  BUN 30* 26*  CREATININE 1.01* 0.87  CALCIUM 9.2 9.2  MG  --  2.0  GFRNONAA 55* >60  ANIONGAP 6 7    Recent Labs  Lab 12/31/22 2113 01/01/23 0427  PROT 7.0 6.5  ALBUMIN 3.8 3.5  AST 46* 41  ALT 83* 75*  ALKPHOS 81 77  BILITOT 0.7 1.0   Lipids No results for input(s): "CHOL", "TRIG", "HDL", "LABVLDL", "LDLCALC", "CHOLHDL" in the last 168 hours.  Hematology Recent Labs  Lab 12/31/22 2113 01/01/23 0427  WBC 6.5 6.6  RBC 3.67* 3.57*  HGB 11.1* 10.8*  HCT 35.0* 34.0*  MCV 95.4 95.2  MCH 30.2 30.3  MCHC 31.7 31.8  RDW 13.2 13.2  PLT 171 161   Thyroid  Recent Labs  Lab 01/01/23 0427  TSH 2.410    BNP Recent Labs  Lab 12/31/22 2113  BNP 229.0*    DDimer No results for input(s): "DDIMER" in the last 168 hours.   Radiology/Studies:  DG Chest 2 View  Result Date: 12/31/2022 CLINICAL DATA:  shob EXAM: CHEST - 2 VIEW.  Patient is rotated. COMPARISON:  None Available. FINDINGS: The heart and mediastinal contours are within normal limits. Biapical pleural/pulmonary scarring. Right base atelectasis. No pulmonary edema. Trace right pleural effusion. No pneumothorax. No acute osseous abnormality. IMPRESSION: Trace right pleural effusion Electronically Signed   By: Tish Frederickson M.D.   On: 12/31/2022 22:22     Assessment and Plan:   1. New Onset Atrial Fibrillation with RVR - This is a new diagnosis for the patient but suspect she has been in the arrhythmia for at least 2 to 4 weeks based off her description of symptoms. An  echocardiogram is pending to assess for any structural abnormalities. - Rates have improved and are currently in the 110's despite only receiving IV Lopressor yesterday evening. Will order Lopressor 25 mg twice daily and can titrate as HR and BP allow. She is currently receiving IV  Heparin and anticipate switching to Eliquis once it is determined she will not require invasive procedures. - We reviewed management strategies and if her rate improves and EF is preserved by echocardiogram, anticipate she can follow-up as an outpatient with possible outpatient DCCV following 3 weeks of anticoagulation if she does not convert in the interim. If she is found to have a new cardiomyopathy which is likely tachycardia-mediated, would anticipate inpatient TEE/DCCV next week  2. Acute CHF Exacerbation (Echo pending to determine HFrEF of HFpEF) - She does report a 7 to 8 pound weight gain prior to admission and says her baseline weight is around 172 lbs  BNP elevated to 229 and CXR did show a trace right pleural effusion. - She has been started on IV Lasix 20 mg daily and will titrate to twice daily dosing.  Follow I&O's along with daily weights. Echocardiogram to be performed this afternoon.  If her EF is reduced, she will require initiation of GDMT.  3. HTN - BP has been elevated, at 155/83 on most recent check. She was on Lisinopril 10 mg daily and will hold this for now to allow for initiation and titration of AV nodal blocking agents.  4. Hypothyroidism - TSH normal at 2.410 this admission. Continue Synthroid at current dosing.   Risk Assessment/Risk Scores:    CHA2DS2-VASc Score = 4   This indicates a 4.8% annual risk of stroke. The patient's score is based upon: CHF History: 0 HTN History: 1 Diabetes History: 0 Stroke History: 0 Vascular Disease History: 0 Age Score: 2 Gender Score: 1  For questions or updates, please contact [facility] Please consult [email or web address] for contact info under    Signed, Ellsworth Lennox, PA-C  01/01/2023 12:13 PM  Patient seen and examined  I agree with findings as noted by B Strader above  Pt is an 82 yo with no prior cardiac Hx  Presents with increased fatigue, DOE  Has been developing over at least 1 month.  On admission  found to be in afib with RVR     She denies palpitations.  No dizziness No Hx of CP      Today rates are better but still high at times      ONexam Neck   JVP is increased    Lungs Rales at R base    Otherwise CTA Cardiac Irreg irreg   No S3  No significant murmurs Abd is benign  No hepatomegaly  Ext with Tr edema  I have reviewed echo   LVEF and RVEF are both depressed   Overall globally though some regional changes     EKG with nonspecific IVCD LV and RV dysfunction may be due to tachycardia (tachyinduced)   Usually, if CAD would not see RV dysfunction as well.  I would recomm  1  Continue with some additional diuresis   I think her volume is still increased 2  Work to improve rate control  3.  Transition to po Eliquis 4  Given LV dysfunction I would recomm TEE/Cardioversion   I think if she is sent home she will not feel good, may represent in CHF before 3 wks is completed.  Dietrich Pates MD

## 2023-01-01 NOTE — Progress Notes (Signed)
ANTICOAGULATION CONSULT NOTE  Pharmacy Consult for heparin Indication: atrial fibrillation  Allergies  Allergen Reactions   Metaxalone Other (See Comments)    Patient Measurements: Height: 5\' 4"  (162.6 cm) Weight: 82.6 kg (182 lb) IBW/kg (Calculated) : 54.7 Heparin Dosing Weight: 72.6 kg  Vital Signs: Temp: 98.3 F (36.8 C) (08/02 0409) Temp Source: Oral (08/02 0409) BP: 155/83 (08/02 0409) Pulse Rate: 96 (08/02 0409)  Labs: Recent Labs    12/31/22 2113 12/31/22 2304 01/01/23 0427  HGB 11.1*  --  10.8*  HCT 35.0*  --  34.0*  PLT 171  --  161  CREATININE 1.01*  --  0.87  TROPONINIHS 10 10  --     Estimated Creatinine Clearance: 51 mL/min (by C-G formula based on SCr of 0.87 mg/dL).   Medical History: Past Medical History:  Diagnosis Date   Hypertension    requires no med, watches diet-pt states it is "white coat syndrome, it's never high at home, usually 120/60"   Hypothyroid    Melanoma of buttock (HCC) 04/2013   2 sites removed by MD   Assessment: 61 yoF presented with shortness of breath on exertion with elevated heart rate. Pharmacy has been consulted for heparin for atrial fibrillation.   Initial heparin level above goal at 0.77. Hgb 10.8, plts 161, and no PTA anticoagulation. No bleeding issues noted.   Goal of Therapy:  Heparin level 0.3-0.7 units/ml Monitor platelets by anticoagulation protocol: Yes   Plan:  Decrease heparin infusion to 850 units/hr Check anti-Xa level in 8 hours and daily while on heparin Continue to monitor H&H and platelets  Sheppard Coil PharmD., BCPS Clinical Pharmacist 01/01/2023 7:54 AM

## 2023-01-02 DIAGNOSIS — I5021 Acute systolic (congestive) heart failure: Secondary | ICD-10-CM | POA: Diagnosis not present

## 2023-01-02 DIAGNOSIS — I4891 Unspecified atrial fibrillation: Secondary | ICD-10-CM | POA: Diagnosis not present

## 2023-01-02 DIAGNOSIS — E039 Hypothyroidism, unspecified: Secondary | ICD-10-CM | POA: Diagnosis not present

## 2023-01-02 MED ORDER — PANTOPRAZOLE SODIUM 40 MG PO TBEC
40.0000 mg | DELAYED_RELEASE_TABLET | Freq: Every day | ORAL | Status: DC
  Administered 2023-01-03 – 2023-01-04 (×2): 40 mg via ORAL
  Filled 2023-01-02 (×2): qty 1

## 2023-01-02 MED ORDER — FUROSEMIDE 40 MG PO TABS
40.0000 mg | ORAL_TABLET | Freq: Two times a day (BID) | ORAL | Status: DC
  Administered 2023-01-02: 40 mg via ORAL
  Filled 2023-01-02 (×2): qty 1

## 2023-01-02 MED ORDER — MORPHINE SULFATE (PF) 2 MG/ML IV SOLN: 1.0000 mg | INTRAVENOUS | Status: DC | PRN

## 2023-01-02 NOTE — Progress Notes (Signed)
PROGRESS NOTE   DEMIAH Gilbert  ZHY:[id number] DOB: 02-11-1941 DOA: 12/31/2022 PCP: Richardean Chimera, MD   Chief Complaint  Patient presents with   Shortness of Breath   Level of care: Telemetry  Brief Admission History:  82 y.o. female with medical history significant of hypertension, hypothyroidism, hyperlipidemia, GERD, presents to ED with a chief complaint of dyspnea.  Patient reports that the first of the week she was feeling general fatigue and malaise.  She did not want to go to her physical therapy for her shoulder because she did not feel well.  Tuesday was no better.  Wednesday symptoms continued so she made appointment with her PCP on Thursday.  PCP sent her to the ER due to rapid heart rate.  Patient reports she notices that she has been easily fatigued.  On further questioning she describes being easily fatigued as dyspneic.  Is worse on exertion.  Its better with rest.  Been going on for about 2 weeks.  She denies any chest pain or palpitations.  She has had some peripheral edema.  She did not notice a peripheral edema until yesterday.  Patient denies any orthopnea or insomnia.  She reports she is on a thyroid pill, which she has been on since she was in her 81s.  She has not had any recent changes to her dosage.  She has no history of irregular heartbeat.  She does report that her mother had congenital valvular heart pathology.  She has no personal history of valvular disease.  Patient has no other complaints at this time.  She has reduced LVEF and cardiology planning to do TEE/cardioversion on 01/04/23 after further diuresis over next couple of days.      Assessment and Plan:  Atrial fibrillation with RVR  - No prior history of atrial fibrillation - Heart rate initially 143, improved with Lopressor 5 mg IV - CHA2DS2-VASc 4 - Started on heparin and transitioned to apixaban per cardiology recommendation  - Troponin 10, 10 reassuring  - Echo with reduced LVEF 25-30% - discussed with  Dr. Tenny Craw, recommendation is for TEE/cardioversion on 01/04/23 - continue current treatment  Acute HFrEF - tachycardia induced - continue IV lasix diuresis 20 mg BID  - cardiology team planning TEE/cardioversion on 01/04/23  Filed Weights   12/31/22 2101 01/02/23 0500  Weight: 82.6 kg 79.9 kg    HLD (hyperlipidemia) - Continue Crestor  Essential hypertension, benign - Continue lisinopril  Hypothyroidism - Continue Synthroid - TSH 2.410 within normal limits   DVT prophylaxis: transitioned to apixaban  Code Status: Full  Family Communication:     Consultants:  cardiology Procedures:  TEE/cardioversion planned for 01/04/23   Antimicrobials:    Subjective: Sitting up in chair.  Less SOB.  No palpitations. No CP.   Objective: Vitals:   01/01/23 2005 01/02/23 0453 01/02/23 0500 01/02/23 0747  BP: (!) 151/75 123/71  137/72  Pulse: 96 77  76  Resp: 17 20  16   Temp: 98.1 F (36.7 C) 98 F (36.7 C)  98.2 F (36.8 C)  TempSrc: Oral Oral  Oral  SpO2: 97% 94%  97%  Weight:   79.9 kg   Height:        Intake/Output Summary (Last 24 hours) at 01/02/2023 1310 Last data filed at 01/02/2023 0900 Gross per 24 hour  Intake 443.87 ml  Output --  Net 443.87 ml   Filed Weights   12/31/22 2101 01/02/23 0500  Weight: 82.6 kg 79.9 kg   Examination:  General exam: frail elderly female, cooperative. Pleasant. NAD. Appears calm and comfortable  Respiratory system: rare bibasilar crackles. No increased work of breathing.  Cardiovascular system: irregularly irregular, normal S1 & S2 heard. Mild JVD. 1+ pedal edema. Gastrointestinal system: Abdomen is nondistended, soft and nontender. No organomegaly or masses felt. Normal bowel sounds heard. Central nervous system: Alert and oriented. No focal neurological deficits. Extremities: Symmetric 5 x 5 power. Skin: No rashes, lesions or ulcers. Psychiatry: Judgement and insight appear normal. Mood & affect appropriate.   Data Reviewed: I have  personally reviewed following labs and imaging studies  CBC: Recent Labs  Lab 12/31/22 2113 01/01/23 0427  WBC 6.5 6.6  NEUTROABS 4.1 4.0  HGB 11.1* 10.8*  HCT 35.0* 34.0*  MCV 95.4 95.2  PLT 171 161    Basic Metabolic Panel: Recent Labs  Lab 12/31/22 2113 01/01/23 0427  NA 139 140  K 4.5 4.1  CL 109 109  CO2 24 24  GLUCOSE 120* 105*  BUN 30* 26*  CREATININE 1.01* 0.87  CALCIUM 9.2 9.2  MG  --  2.0    CBG: No results for input(s): "GLUCAP" in the last 168 hours.  No results found for this or any previous visit (from the past 240 hour(s)).   Radiology Studies: ECHOCARDIOGRAM COMPLETE  Result Date: 01/01/2023    ECHOCARDIOGRAM REPORT   Patient Name:   Kaylee Gilbert Date of Exam: 01/01/2023 Medical Rec #:  [id number]        Height:       64.0 in Accession #:    [id number]       Weight:       182.0 lb Date of Birth:  [birth year]        BSA:          1.880 m Patient Age:    82 years         BP:           141/71 mmHg Patient Gender: F                HR:           120 bpm. Exam Location:  [facility] Procedure: 2D Echo, Color Doppler and Cardiac Doppler Indications:    I48.91* Unspeicified atrial fibrillation  History:        Patient has no prior history of Echocardiogram examinations.                 Arrythmias:Atrial Fibrillation; Risk Factors:Hypertension.  Sonographer:    Darlys Gales Referring Phys: [id number] ASIA B ZIERLE-GHOSH IMPRESSIONS  1. LVEF is depressed with global hypokinesis worse in the inferior, septal and apical walls . Left ventricular ejection fraction, by estimation, is 25 to 30%. The left ventricle has severely decreased function. There is mild left ventricular hypertrophy. Left ventricular diastolic parameters are indeterminate.  2. Right ventricular systolic function is moderately reduced. The right ventricular size is normal.  3. The mitral valve is normal in structure. Trivial mitral valve regurgitation.  4. The aortic valve is normal in structure. Aortic valve  regurgitation is not visualized.  5. The inferior vena cava is dilated in size with <50% respiratory variability, suggesting right atrial pressure of 15 mmHg. FINDINGS  Left Ventricle: LVEF is depressed with global hypokinesis worse in the inferior, septal and apical walls. Left ventricular ejection fraction, by estimation, is 25 to 30%. The left ventricle has severely decreased function. The left ventricular internal cavity size was normal in size. There is mild  left ventricular hypertrophy. Left ventricular diastolic parameters are indeterminate. Right Ventricle: The right ventricular size is normal. Right vetricular wall thickness was not assessed. Right ventricular systolic function is moderately reduced. Left Atrium: Left atrial size was normal in size. Right Atrium: Right atrial size was normal in size. Pericardium: Trivial pericardial effusion is present. Mitral Valve: The mitral valve is normal in structure. Trivial mitral valve regurgitation. Tricuspid Valve: The tricuspid valve is normal in structure. Tricuspid valve regurgitation is mild. Aortic Valve: The aortic valve is normal in structure. Aortic valve regurgitation is not visualized. Pulmonic Valve: The pulmonic valve was not well visualized. Pulmonic valve regurgitation is not visualized. Aorta: The aortic root is normal in size and structure. Venous: The inferior vena cava is dilated in size with less than 50% respiratory variability, suggesting right atrial pressure of 15 mmHg. IAS/Shunts: No atrial level shunt detected by color flow Doppler.  LEFT VENTRICLE PLAX 2D LVIDd:         4.40 cm LVIDs:         3.30 cm LV PW:         1.00 cm LV IVS:        1.30 cm LVOT diam:     1.90 cm LVOT Area:     2.84 cm  RIGHT VENTRICLE         IVC TAPSE (M-mode): 1.5 cm  IVC diam: 2.60 cm LEFT ATRIUM             Index        RIGHT ATRIUM           Index LA Vol (A2C):   38.7 ml 20.59 ml/m  RA Area:     13.30 cm LA Vol (A4C):   44.2 ml 23.52 ml/m  RA Volume:    28.60 ml  15.22 ml/m LA Biplane Vol: 45.6 ml 24.26 ml/m   AORTA Ao Root diam: 2.80 cm TRICUSPID VALVE TR Peak grad:   29.2 mmHg TR Vmax:        270.00 cm/s  SHUNTS Systemic Diam: 1.90 cm Dietrich Pates MD Electronically signed by Dietrich Pates MD Signature Date/Time: 01/01/2023/2:08:47 PM    Final    DG Chest 2 View  Result Date: 12/31/2022 CLINICAL DATA:  shob EXAM: CHEST - 2 VIEW.  Patient is rotated. COMPARISON:  None Available. FINDINGS: The heart and mediastinal contours are within normal limits. Biapical pleural/pulmonary scarring. Right base atelectasis. No pulmonary edema. Trace right pleural effusion. No pneumothorax. No acute osseous abnormality. IMPRESSION: Trace right pleural effusion Electronically Signed   By: Tish Frederickson M.D.   On: 12/31/2022 22:22    Scheduled Meds:  apixaban  5 mg Oral BID   aspirin EC  81 mg Oral Daily   furosemide  20 mg Intravenous BID   levothyroxine  88 mcg Oral QAC breakfast   metoprolol tartrate  25 mg Oral BID   pantoprazole (PROTONIX) IV  40 mg Intravenous Q12H   rosuvastatin  10 mg Oral Daily   Continuous Infusions:   LOS: 1 day   Time spent: 35 mins  Dionicio Shelnutt Laural Benes, MD How to contact the [facility] Attending or Consulting provider 7A - 7P or covering provider during after hours 7P -7A, for this patient?  Check the care team in [facility] and look for a) attending/consulting [facility] provider listed and b) the [facility] team listed Log into [email or web address] and use [place]'s universal password to access. If you do not have the password, please contact the hospital operator. Locate  the [facility] provider you are looking for under [facility] and page to a number that you can be directly reached. If you still have difficulty reaching the provider, please page the [facility] (Director on Call) for the Hospitalists listed on amion for assistance.  01/02/2023, 1:10 PM

## 2023-01-02 NOTE — Progress Notes (Signed)
01/02/2023 6:02 PM  Informed by nursing that pt lost IV access and multiple attempts by different nurses and AC have been unsuccessful.  Changed over IV meds to p.o.   CJohnson MD

## 2023-01-02 NOTE — Plan of Care (Signed)

## 2023-01-03 DIAGNOSIS — I5021 Acute systolic (congestive) heart failure: Secondary | ICD-10-CM | POA: Diagnosis not present

## 2023-01-03 DIAGNOSIS — E039 Hypothyroidism, unspecified: Secondary | ICD-10-CM | POA: Diagnosis not present

## 2023-01-03 DIAGNOSIS — I4891 Unspecified atrial fibrillation: Secondary | ICD-10-CM | POA: Diagnosis not present

## 2023-01-03 LAB — BASIC METABOLIC PANEL WITH GFR
Anion gap: 9 (ref 5–15)
BUN: 34 mg/dL — ABNORMAL HIGH (ref 8–23)
CO2: 26 mmol/L (ref 22–32)
Calcium: 8.8 mg/dL — ABNORMAL LOW (ref 8.9–10.3)
Chloride: 103 mmol/L (ref 98–111)
Creatinine, Ser: 1.45 mg/dL — ABNORMAL HIGH (ref 0.44–1.00)
GFR, Estimated: 36 mL/min — ABNORMAL LOW (ref >60)
Glucose, Bld: 84 mg/dL (ref 70–99)
Potassium: 3.4 mmol/L — ABNORMAL LOW (ref 3.5–5.1)
Sodium: 138 mmol/L (ref 135–145)

## 2023-01-03 LAB — MAGNESIUM: Magnesium: 1.8 mg/dL (ref 1.7–2.4)

## 2023-01-03 MED ORDER — METOPROLOL TARTRATE 25 MG PO TABS
12.5000 mg | ORAL_TABLET | Freq: Two times a day (BID) | ORAL | Status: DC
  Administered 2023-01-03 – 2023-01-04 (×2): 12.5 mg via ORAL
  Filled 2023-01-03 (×2): qty 1

## 2023-01-03 MED ORDER — APIXABAN 2.5 MG PO TABS
2.5000 mg | ORAL_TABLET | Freq: Two times a day (BID) | ORAL | Status: DC
  Administered 2023-01-03: 2.5 mg via ORAL
  Filled 2023-01-03: qty 1

## 2023-01-03 MED ORDER — POTASSIUM CHLORIDE CRYS ER 20 MEQ PO TBCR
20.0000 meq | EXTENDED_RELEASE_TABLET | Freq: Once | ORAL | Status: AC
  Administered 2023-01-03: 20 meq via ORAL
  Filled 2023-01-03: qty 1

## 2023-01-03 MED ORDER — MAGNESIUM SULFATE 2 GM/50ML IV SOLN
2.0000 g | Freq: Once | INTRAVENOUS | Status: AC
  Administered 2023-01-03: 2 g via INTRAVENOUS
  Filled 2023-01-03: qty 50

## 2023-01-03 NOTE — Progress Notes (Signed)
Contacted Dr. Dietrich Pates concerning possible TEE for tomorrow.  She said that [place] will be working with anesthesia to see if it can be done. Patient is npo after midnight.

## 2023-01-03 NOTE — Progress Notes (Signed)
PROGRESS NOTE   Kaylee Gilbert  NFA:[id number] DOB: 09-13-1940 DOA: 12/31/2022 PCP: Richardean Chimera, MD   Chief Complaint  Patient presents with   Shortness of Breath   Level of care: Telemetry  Brief Admission History:  82 y.o. female with medical history significant of hypertension, hypothyroidism, hyperlipidemia, GERD, presents to ED with a chief complaint of dyspnea.  Patient reports that the first of the week she was feeling general fatigue and malaise.  She did not want to go to her physical therapy for her shoulder because she did not feel well.  Tuesday was no better.  Wednesday symptoms continued so she made appointment with her PCP on Thursday.  PCP sent her to the ER due to rapid heart rate.  Patient reports she notices that she has been easily fatigued.  On further questioning she describes being easily fatigued as dyspneic.  Is worse on exertion.  Its better with rest.  Been going on for about 2 weeks.  She denies any chest pain or palpitations.  She has had some peripheral edema.  She did not notice a peripheral edema until yesterday.  Patient denies any orthopnea or insomnia.  She reports she is on a thyroid pill, which she has been on since she was in her 72s.  She has not had any recent changes to her dosage.  She has no history of irregular heartbeat.  She does report that her mother had congenital valvular heart pathology.  She has no personal history of valvular disease.  Patient has no other complaints at this time.  She has reduced LVEF and cardiology planning to do TEE/cardioversion on 01/04/23 after further diuresis over next couple of days.      Assessment and Plan:  Atrial fibrillation with RVR  - No prior history of atrial fibrillation - Heart rate initially 143, improved with Lopressor 5 mg IV - CHA2DS2-VASc 4 - Started on heparin and transitioned to apixaban per cardiology recommendation  - Troponin 10, 10 reassuring  - Echo with reduced LVEF 25-30% - discussed with  Dr. Tenny Craw, recommendation is for TEE/cardioversion on 01/04/23 - NPO after midnight - furosemide on hold due to bump in creatinine  Acute HFrEF - tachycardia induced - IV lasix diuresis 20 mg BID on hold when IV access lost, then oral furosemide stopped due to bump in creatinine - cardiology team planning TEE/cardioversion on 01/04/23  Filed Weights   12/31/22 2101 01/02/23 0500 01/03/23 0519  Weight: 82.6 kg 79.9 kg 79.7 kg  - recheck basic metabolic panel in AM   Acute kidney injury  - hold furosemide temporarily to give diuretic holiday -  recheck basic metabolic panel in AM   HLD (hyperlipidemia) - Continue Crestor  Essential hypertension, benign - hold lisinopril with AKI   Hypothyroidism - Continue Synthroid - TSH 2.410 within normal limits   DVT prophylaxis: transitioned to apixaban  Code Status: Full  Family Communication: bedside updates    Consultants:  cardiology Procedures:  TEE/cardioversion tentatively planned for 01/04/23   Antimicrobials:    Subjective: Much less SOB, no CP, no palpitations.    Objective: Vitals:   01/02/23 1411 01/02/23 1610 01/02/23 1936 01/03/23 0519  BP: 111/77 119/62 118/86 (!) 139/59  Pulse: 73 72 76 62  Resp: 20 16 20 20   Temp: (!) 97.5 F (36.4 C) 98.2 F (36.8 C) 97.9 F (36.6 C) 97.8 F (36.6 C)  TempSrc: Oral Oral Oral Oral  SpO2: 96% 96% 94% 96%  Weight:  79.7 kg  Height:        Intake/Output Summary (Last 24 hours) at 01/03/2023 1407 Last data filed at 01/03/2023 1300 Gross per 24 hour  Intake 240 ml  Output --  Net 240 ml   Filed Weights   12/31/22 2101 01/02/23 0500 01/03/23 0519  Weight: 82.6 kg 79.9 kg 79.7 kg   Examination:  General exam: frail elderly female, cooperative. Pleasant. NAD. Appears calm and comfortable  Respiratory system: clear to auscultation bilateral. No increased work of breathing.  Cardiovascular system: irregularly irregular, normal S1 & S2 heard. Mild JVD. 1+ pedal  edema. Gastrointestinal system: Abdomen is nondistended, soft and nontender. No organomegaly or masses felt. Normal bowel sounds heard. Central nervous system: Alert and oriented. No focal neurological deficits. Extremities: Symmetric 5 x 5 power. Skin: No rashes, lesions or ulcers. Psychiatry: Judgement and insight appear normal. Mood & affect appropriate.   Data Reviewed: I have personally reviewed following labs and imaging studies  CBC: Recent Labs  Lab 12/31/22 2113 01/01/23 0427  WBC 6.5 6.6  NEUTROABS 4.1 4.0  HGB 11.1* 10.8*  HCT 35.0* 34.0*  MCV 95.4 95.2  PLT 171 161    Basic Metabolic Panel: Recent Labs  Lab 12/31/22 2113 01/01/23 0427 01/03/23 0426  NA 139 140 138  K 4.5 4.1 3.4*  CL 109 109 103  CO2 24 24 26   GLUCOSE 120* 105* 84  BUN 30* 26* 34*  CREATININE 1.01* 0.87 1.45*  CALCIUM 9.2 9.2 8.8*  MG  --  2.0 1.8    CBG: No results for input(s): "GLUCAP" in the last 168 hours.  No results found for this or any previous visit (from the past 240 hour(s)).   Radiology Studies: No results found.  Scheduled Meds:  apixaban  2.5 mg Oral BID   aspirin EC  81 mg Oral Daily   levothyroxine  88 mcg Oral QAC breakfast   metoprolol tartrate  12.5 mg Oral BID   pantoprazole  40 mg Oral Q0600   rosuvastatin  10 mg Oral Daily   Continuous Infusions:   LOS: 2 days   Time spent: 35 mins  Kuzey Ogata Laural Benes, MD How to contact the [facility] Attending or Consulting provider 7A - 7P or covering provider during after hours 7P -7A, for this patient?  Check the care team in [facility] and look for a) attending/consulting TRH provider listed and b) the [facility] team listed Log into [email or web address] and use [place]'s universal password to access. If you do not have the password, please contact the hospital operator. Locate the [facility] provider you are looking for under [facility] and page to a number that you can be directly reached. If you still have difficulty reaching the  provider, please page the [facility] (Director on Call) for the Hospitalists listed on amion for assistance.  01/03/2023, 2:07 PM

## 2023-01-04 DIAGNOSIS — I4891 Unspecified atrial fibrillation: Secondary | ICD-10-CM | POA: Diagnosis not present

## 2023-01-04 DIAGNOSIS — I10 Essential (primary) hypertension: Secondary | ICD-10-CM | POA: Diagnosis not present

## 2023-01-04 DIAGNOSIS — I4892 Unspecified atrial flutter: Secondary | ICD-10-CM

## 2023-01-04 DIAGNOSIS — I5021 Acute systolic (congestive) heart failure: Secondary | ICD-10-CM | POA: Diagnosis not present

## 2023-01-04 DIAGNOSIS — R000 Tachycardia, unspecified: Secondary | ICD-10-CM

## 2023-01-04 DIAGNOSIS — E785 Hyperlipidemia, unspecified: Secondary | ICD-10-CM | POA: Diagnosis not present

## 2023-01-04 DIAGNOSIS — I43 Cardiomyopathy in diseases classified elsewhere: Secondary | ICD-10-CM

## 2023-01-04 DIAGNOSIS — G4733 Obstructive sleep apnea (adult) (pediatric): Secondary | ICD-10-CM

## 2023-01-04 MED ORDER — APIXABAN 5 MG PO TABS
5.0000 mg | ORAL_TABLET | Freq: Two times a day (BID) | ORAL | Status: DC
  Administered 2023-01-04: 5 mg via ORAL
  Filled 2023-01-04: qty 1

## 2023-01-04 MED ORDER — METOPROLOL TARTRATE 25 MG PO TABS: 12.5000 mg | ORAL_TABLET | Freq: Two times a day (BID) | ORAL | 2 refills | Status: DC

## 2023-01-04 MED ORDER — APIXABAN 5 MG PO TABS: 5.0000 mg | ORAL_TABLET | Freq: Two times a day (BID) | ORAL | 2 refills | Status: DC

## 2023-01-04 MED ORDER — FUROSEMIDE 20 MG PO TABS: 20.0000 mg | ORAL_TABLET | Freq: Every day | ORAL | 1 refills | Status: DC | PRN

## 2023-01-04 NOTE — Plan of Care (Signed)
°  Problem: Coping: °Goal: Level of anxiety will decrease °Outcome: Progressing °  °

## 2023-01-04 NOTE — Progress Notes (Signed)
IV removed and discharge instructions reviewed  Scripts sent to pharmacy.  Transported to main entrance and husband to drive home.

## 2023-01-04 NOTE — Plan of Care (Signed)

## 2023-01-04 NOTE — Discharge Summary (Signed)
Physician Discharge Summary  Kaylee Gilbert QMV:[id number] DOB: January 22, 1941 DOA: 12/31/2022  PCP: Richardean Chimera, MD Cardiology: [facility]   Admit date: 12/31/2022 Discharge date: 01/04/2023  Admitted From:  Home  Disposition: Home   Recommendations for Outpatient Follow-up:  Follow up with PCP in 1 weeks Follow up with cardiology on 01/13/23 as scheduled  Please check BMP in 1-2 weeks  Discharge Condition: STABLE   CODE STATUS: FULL DIET: low sodium recommended    Brief Hospitalization Summary: Please see all hospital notes, images, labs for full details of the hospitalization. ADMISSION PROVIDER HPI:  82 y.o. female with medical history significant of hypertension, hypothyroidism, hyperlipidemia, GERD, presents to ED with a chief complaint of dyspnea.  Patient reports that the first of the week she was feeling general fatigue and malaise.  She did not want to go to her physical therapy for her shoulder because she did not feel well.  Tuesday was no better.  Wednesday symptoms continued so she made appointment with her PCP on Thursday.  PCP sent her to the ER due to rapid heart rate.  Patient reports she notices that she has been easily fatigued.  On further questioning she describes being easily fatigued as dyspneic.  Is worse on exertion.  Its better with rest.  Been going on for about 2 weeks.  She denies any chest pain or palpitations.  She has had some peripheral edema.  She did not notice a peripheral edema until yesterday.  Patient denies any orthopnea or insomnia.  She reports she is on a thyroid pill, which she has been on since she was in her 12s.  She has not had any recent changes to her dosage.  She has no history of irregular heartbeat.  She does report that her mother had congenital valvular heart pathology.  She has no personal history of valvular disease.  Patient has no other complaints at this time.   HOSPITAL COURSE BY PROBLEM LIST   Atrial fibrillation with RVR  - No  prior history of atrial fibrillation - Heart rate initially 143, improved with Lopressor 5 mg IV - CHA2DS2-VASc 4 - Started on heparin and transitioned to apixaban per cardiology recommendation  - Echo with reduced LVEF 25-30% - discussed with Dr. Tenny Craw, recommendation is for TEE/cardioversion on 01/04/23 but procedure cancelled when pt spontaneously converted to sinus rhythm - HR well controlled on metoprolol 12.5 mg BID   Acute HFrEF - tachycardia induced - initially started on IV lasix diuresis 20 mg BID but placed on hold when IV access lost, then oral furosemide stopped due to bump in creatinine - cardiology team planned TEE/cardioversion on 01/04/23 but cancelled due to spontaneous conversion to sinus rhythm - cardiology team recommended DC home on oral lasix 20 mg take PRN for SOB, LE edema  Filed Weights   01/02/23 0500 01/03/23 0519 01/04/23 0500  Weight: 79.9 kg 79.7 kg 79.6 kg   Acute kidney injury  - held furosemide temporarily to give diuretic holiday - creatinine improved to 1.07   HLD (hyperlipidemia) - Continue Crestor 5 mg daily    Essential hypertension, benign - metoprolol 12.5 mg BID    Hypothyroidism - Continue Synthroid - TSH 2.410 within normal limits    DVT prophylaxis: transitioned to apixaban 5 mg BID Code Status: Full  Family Communication: bedside updates    Consultants:  cardiology Procedures:  TEE/cardioversion tentatively planned for 01/04/23 - cancelled   Discharge Diagnoses:  Principal Problem:   Atrial fibrillation with RVR (  HCC) Active Problems:   Hypothyroidism   Essential hypertension, benign   HLD (hyperlipidemia)   Acute HFrEF (heart failure with reduced ejection fraction) [facility])   Discharge Instructions: Discharge Instructions     Amb referral to [facility] Clinic   Complete by: As directed       Allergies as of 01/04/2023       Reactions   Metaxalone Other (See Comments)        Medication List     STOP taking these  medications    losartan 100 MG tablet Commonly known as: COZAAR       TAKE these medications    acetaminophen 325 MG tablet Commonly known as: TYLENOL Take 650 mg by mouth every 6 (six) hours as needed for headache.   apixaban 5 MG Tabs tablet Commonly known as: ELIQUIS Take 1 tablet (5 mg total) by mouth 2 (two) times daily.   cetirizine 10 MG tablet Commonly known as: ZYRTEC Take 10 mg by mouth daily as needed for allergies.   furosemide 20 MG tablet Commonly known as: Lasix Take 1 tablet (20 mg total) by mouth daily as needed for edema or fluid (shortness of breath).   HAIR/SKIN/NAILS PO Take 1 tablet by mouth daily as needed (brittle nails).   levothyroxine 88 MCG tablet Commonly known as: SYNTHROID Take 1 tablet by mouth daily before breakfast.   metoprolol tartrate 25 MG tablet Commonly known as: LOPRESSOR Take 0.5 tablets (12.5 mg total) by mouth 2 (two) times daily.   omeprazole 20 MG capsule Commonly known as: PRILOSEC Take 20 mg by mouth daily.   rosuvastatin 5 MG tablet Commonly known as: CRESTOR Take 5 mg by mouth daily.        Follow-up Information     Richardean Chimera, MD. Schedule an appointment as soon as possible for a visit in 1 week(s).   Specialty: Family Medicine Why: Hospital Follow Up Contact information: [address] [place] [address] [phone]         Jonelle Sidle, MD. Go on 01/13/2023.   Specialty: Cardiology Why: Hospital Follow Up at 11:40 am as scheduled Contact information: [address] [place] [place] [address] [phone]                Allergies  Allergen Reactions   Metaxalone Other (See Comments)   Allergies as of 01/04/2023       Reactions   Metaxalone Other (See Comments)        Medication List     STOP taking these medications    losartan 100 MG tablet Commonly known as: COZAAR       TAKE these medications    acetaminophen 325 MG tablet Commonly known as: TYLENOL Take  650 mg by mouth every 6 (six) hours as needed for headache.   apixaban 5 MG Tabs tablet Commonly known as: ELIQUIS Take 1 tablet (5 mg total) by mouth 2 (two) times daily.   cetirizine 10 MG tablet Commonly known as: ZYRTEC Take 10 mg by mouth daily as needed for allergies.   furosemide 20 MG tablet Commonly known as: Lasix Take 1 tablet (20 mg total) by mouth daily as needed for edema or fluid (shortness of breath).   HAIR/SKIN/NAILS PO Take 1 tablet by mouth daily as needed (brittle nails).   levothyroxine 88 MCG tablet Commonly known as: SYNTHROID Take 1 tablet by mouth daily before breakfast.   metoprolol tartrate 25 MG tablet Commonly known as: LOPRESSOR Take  0.5 tablets (12.5 mg total) by mouth 2 (two) times daily.   omeprazole 20 MG capsule Commonly known as: PRILOSEC Take 20 mg by mouth daily.   rosuvastatin 5 MG tablet Commonly known as: CRESTOR Take 5 mg by mouth daily.        Procedures/Studies: ECHOCARDIOGRAM COMPLETE  Result Date: 01/01/2023    ECHOCARDIOGRAM REPORT   Patient Name:   DERRICA ALIOTO Date of Exam: 01/01/2023 Medical Rec #:  [id number]        Height:       64.0 in Accession #:    [id number]       Weight:       182.0 lb Date of Birth:  06-03-1940        BSA:          1.880 m Patient Age:    82 years         BP:           141/71 mmHg Patient Gender: F                HR:           120 bpm. [facility] Procedure: 2D Echo, Color Doppler and Cardiac Doppler Indications:    I48.91* Unspeicified atrial fibrillation  History:        Patient has no prior history of Echocardiogram examinations.                 Arrythmias:Atrial Fibrillation; Risk Factors:Hypertension.  Sonographer:    Darlys Gales Referring Phys: [id number] ASIA B ZIERLE-GHOSH IMPRESSIONS  1. LVEF is depressed with global hypokinesis worse in the inferior, septal and apical walls . Left ventricular ejection fraction, by estimation, is 25 to 30%. The left ventricle has severely  decreased function. There is mild left ventricular hypertrophy. Left ventricular diastolic parameters are indeterminate.  2. Right ventricular systolic function is moderately reduced. The right ventricular size is normal.  3. The mitral valve is normal in structure. Trivial mitral valve regurgitation.  4. The aortic valve is normal in structure. Aortic valve regurgitation is not visualized.  5. The inferior vena cava is dilated in size with <50% respiratory variability, suggesting right atrial pressure of 15 mmHg. FINDINGS  Left Ventricle: LVEF is depressed with global hypokinesis worse in the inferior, septal and apical walls. Left ventricular ejection fraction, by estimation, is 25 to 30%. The left ventricle has severely decreased function. The left ventricular internal cavity size was normal in size. There is mild left ventricular hypertrophy. Left ventricular diastolic parameters are indeterminate. Right Ventricle: The right ventricular size is normal. Right vetricular wall thickness was not assessed. Right ventricular systolic function is moderately reduced. Left Atrium: Left atrial size was normal in size. Right Atrium: Right atrial size was normal in size. Pericardium: Trivial pericardial effusion is present. Mitral Valve: The mitral valve is normal in structure. Trivial mitral valve regurgitation. Tricuspid Valve: The tricuspid valve is normal in structure. Tricuspid valve regurgitation is mild. Aortic Valve: The aortic valve is normal in structure. Aortic valve regurgitation is not visualized. Pulmonic Valve: The pulmonic valve was not well visualized. Pulmonic valve regurgitation is not visualized. Aorta: The aortic root is normal in size and structure. Venous: The inferior vena cava is dilated in size with less than 50% respiratory variability, suggesting right atrial pressure of 15 mmHg. IAS/Shunts: No atrial level shunt detected by color flow Doppler.  LEFT VENTRICLE PLAX 2D LVIDd:         4.40  cm  LVIDs:         3.30 cm LV PW:         1.00 cm LV IVS:        1.30 cm LVOT diam:     1.90 cm LVOT Area:     2.84 cm  RIGHT VENTRICLE         IVC TAPSE (M-mode): 1.5 cm  IVC diam: 2.60 cm LEFT ATRIUM             Index        RIGHT ATRIUM           Index LA Vol (A2C):   38.7 ml 20.59 ml/m  RA Area:     13.30 cm LA Vol (A4C):   44.2 ml 23.52 ml/m  RA Volume:   28.60 ml  15.22 ml/m LA Biplane Vol: 45.6 ml 24.26 ml/m   AORTA Ao Root diam: 2.80 cm TRICUSPID VALVE TR Peak grad:   29.2 mmHg TR Vmax:        270.00 cm/s  SHUNTS Systemic Diam: 1.90 cm Dietrich Pates MD Electronically signed by Dietrich Pates MD Signature Date/Time: 01/01/2023/2:08:47 PM    Final    DG Chest 2 View  Result Date: 12/31/2022 CLINICAL DATA:  shob EXAM: CHEST - 2 VIEW.  Patient is rotated. COMPARISON:  None Available. FINDINGS: The heart and mediastinal contours are within normal limits. Biapical pleural/pulmonary scarring. Right base atelectasis. No pulmonary edema. Trace right pleural effusion. No pneumothorax. No acute osseous abnormality. IMPRESSION: Trace right pleural effusion Electronically Signed   By: Tish Frederickson M.D.   On: 12/31/2022 22:22     Subjective: Pt reports that she is feeling much better and she is eager to go home today.  She denies palpitations and denies CP and SOB.    Discharge Exam: Vitals:   01/03/23 2033 01/04/23 0423  BP: (!) 151/75 (!) 162/80  Pulse: 64 73  Resp: 16 17  Temp: 97.8 F (36.6 C) 98 F (36.7 C)  SpO2: 98% 93%   Vitals:   01/03/23 1600 01/03/23 2033 01/04/23 0423 01/04/23 0500  BP: (!) 137/56 (!) 151/75 (!) 162/80   Pulse: 62 64 73   Resp: 16 16 17    Temp: 98.2 F (36.8 C) 97.8 F (36.6 C) 98 F (36.7 C)   TempSrc: Oral Oral Oral   SpO2: 97% 98% 93%   Weight:    79.6 kg  Height:       General: Pt is alert, awake, not in acute distress Cardiovascular: normal S1/S2 +, no rubs, no gallops Respiratory: CTA bilaterally, no wheezing, no rhonchi Abdominal: Soft, NT, ND, bowel  sounds + Extremities: no edema, no cyanosis   The results of significant diagnostics from this hospitalization (including imaging, microbiology, ancillary and laboratory) are listed below for reference.     Microbiology: No results found for this or any previous visit (from the past 240 hour(s)).   Labs: BNP (last 3 results) Recent Labs    12/31/22 2113  BNP 229.0*   Basic Metabolic Panel: Recent Labs  Lab 12/31/22 2113 01/01/23 0427 01/03/23 0426 01/04/23 0454  NA 139 140 138 140  K 4.5 4.1 3.4* 3.8  CL 109 109 103 106  CO2 24 24 26 27   GLUCOSE 120* 105* 84 89  BUN 30* 26* 34* 30*  CREATININE 1.01* 0.87 1.45* 1.07*  CALCIUM 9.2 9.2 8.8* 9.3  MG  --  2.0 1.8 2.1   Liver Function Tests: Recent Labs  Lab 12/31/22 2113 01/01/23 0427  AST 46* 41  ALT 83* 75*  ALKPHOS 81 77  BILITOT 0.7 1.0  PROT 7.0 6.5  ALBUMIN 3.8 3.5   No results for input(s): "LIPASE", "AMYLASE" in the last 168 hours. No results for input(s): "AMMONIA" in the last 168 hours. CBC: Recent Labs  Lab 12/31/22 2113 01/01/23 0427 01/04/23 0454  WBC 6.5 6.6 6.8  NEUTROABS 4.1 4.0  --   HGB 11.1* 10.8* 11.0*  HCT 35.0* 34.0* 34.0*  MCV 95.4 95.2 93.7  PLT 171 161 183   Cardiac Enzymes: No results for input(s): "CKTOTAL", "CKMB", "CKMBINDEX", "TROPONINI" in the last 168 hours. BNP: Invalid input(s): "POCBNP" CBG: No results for input(s): "GLUCAP" in the last 168 hours. D-Dimer No results for input(s): "DDIMER" in the last 72 hours. Hgb A1c No results for input(s): "HGBA1C" in the last 72 hours. Lipid Profile No results for input(s): "CHOL", "HDL", "LDLCALC", "TRIG", "CHOLHDL", "LDLDIRECT" in the last 72 hours. Thyroid function studies No results for input(s): "TSH", "T4TOTAL", "T3FREE", "THYROIDAB" in the last 72 hours.  Invalid input(s): "FREET3" Anemia work up No results for input(s): "VITAMINB12", "FOLATE", "FERRITIN", "TIBC", "IRON", "RETICCTPCT" in the last 72  hours. Urinalysis No results found for: "COLORURINE", "APPEARANCEUR", "LABSPEC", "PHURINE", "GLUCOSEU", "HGBUR", "BILIRUBINUR", "KETONESUR", "PROTEINUR", "UROBILINOGEN", "NITRITE", "LEUKOCYTESUR" Sepsis Labs Recent Labs  Lab 12/31/22 2113 01/01/23 0427 01/04/23 0454  WBC 6.5 6.6 6.8   Microbiology No results found for this or any previous visit (from the past 240 hour(s)).  Time coordinating discharge:  41 mins   SIGNED:  Standley Dakins, MD  [facility] 01/04/2023, 11:08 AM How to contact the [facility] Attending or Consulting provider 7A - 7P or covering provider during after hours 7P -7A, for this patient?  Check the care team in [facility] and look for a) attending/consulting TRH provider listed and b) the [facility] team listed Log into [email or web address] and use [place]'s universal password to access. If you do not have the password, please contact the hospital operator. Locate the [facility] provider you are looking for under [facility] and page to a number that you can be directly reached. If you still have difficulty reaching the provider, please page the [facility] (Director on Call) for the Hospitalists listed on amion for assistance.

## 2023-01-04 NOTE — Progress Notes (Addendum)
Patient Name: Kaylee Gilbert Date of Encounter: 01/04/2023 [facility] Cardiologist: Nona Dell, MD    Interval Summary  .    Hoping to go home. Doesn't feel afib/flutter  Vital Signs .    Vitals:   01/03/23 1600 01/03/23 2033 01/04/23 0423 01/04/23 0500  BP: (!) 137/56 (!) 151/75 (!) 162/80   Pulse: 62 64 73   Resp: 16 16 17    Temp: 98.2 F (36.8 C) 97.8 F (36.6 C) 98 F (36.7 C)   TempSrc: Oral Oral Oral   SpO2: 97% 98% 93%   Weight:    79.6 kg  Height:        Intake/Output Summary (Last 24 hours) at 01/04/2023 0854 Last data filed at 01/03/2023 1300 Gross per 24 hour  Intake 240 ml  Output --  Net 240 ml      01/04/2023    5:00 AM 01/03/2023    5:19 AM 01/02/2023    5:00 AM  Last 3 Weights  Weight (lbs) 175 lb 7.8 oz 175 lb 11.3 oz 176 lb 2.4 oz  Weight (kg) 79.6 kg 79.7 kg 79.9 kg      Telemetry/ECG    NSR with LBBB - Personally Reviewed  Physical Exam .   GEN: No acute distress.   Neck: No JVD Cardiac:  RRR, no murmurs, rubs, or gallops.  Respiratory: Clear to auscultation bilaterally. GI: Soft, nontender, non-distended  MS: No edema  Assessment & Plan .     Atrial flutter converted to NSR on Eliquis 2.5 mg bid-should be on 5 mg bid, also stop ASA. Continue metoprolol 12.5 mg bid.  -patient request f/u with Dr. McDowell(husband sees him) in [place] office-8/15 11:40 with Dr. 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 I/O's positive if accurate-can f/u echo as OP. Compensated today. Would resume losartan.  HTN BP running on high side-hasn't received meds yet. Was on losartan 100 mg PTA, would resume and f/u bmet in 1 week.  Hypothyroidism per PCP  For questions or updates, please contact [facility] Please consult [email or web address] for contact info under        Signed, Jacolyn Reedy, PA-C    Attending attestation  Patient seen and independently examined with  Jacolyn Reedy, PA-C. We discussed all aspects of the encounter. I agree with the assessment and plan as stated above.  Briefly, patient is 82 year old F known to have HTN, hypothyroidism presented to the ER with DOE, bilateral lower EXTR swelling x 2 weeks and incidental EKG finding of atrial flutter with RVR, HR 130s.  Echo on admission showed LVEF 25 to 30% with some RWMA and moderate RV dysfunction.  Physical exam showed HEENT normal, no JVD, S1-S2 normal, no murmur, clear lungs, abdomen NTND, no edema.  EKG today showed NSR, LBBB.  She is originally n.p.o. for TEE guided DCCV today but she spontaneously converted to NSR in the meantime. Continue metoprolol tartrate 12.5 mg twice daily, Eliquis 5 mg twice daily. Serum creatinine improved to 1.07 this a.m.  She was diagnosed with mild OSA in the past, can consider outpatient OSA evaluation again.  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  Outpatient limited echocardiogram can be pursued to document improvement in LVEF in 1 month.  If there is no improvement, ischemia evaluation can be considered and start GDMT.  She already  has an appointment with Dr. Diona Browner on 01/13/2023.  [facility] will sign off.   Medication Recommendations: Metoprolol tartrate 12.5 mg twice daily, Eliquis 5 mg twice daily, p.o. Lasix 20 mg as needed for SOB/LE swelling Other recommendations (labs, testing, etc): None Follow up as an outpatient: Keep appointment with Dr. Diona Browner on 01/13/2023   Rosina Cressler Verne Spurr, MD [address]  [facility]  10:44 AM

## 2023-01-13 ENCOUNTER — Encounter: Payer: Self-pay | Admitting: Cardiology

## 2023-01-13 ENCOUNTER — Other Ambulatory Visit: Payer: Self-pay | Admitting: Cardiology

## 2023-01-13 ENCOUNTER — Ambulatory Visit: Payer: Medicare Other | Attending: Cardiology | Admitting: Cardiology

## 2023-01-13 VITALS — BP 148/76 | HR 62 | Ht 64.0 in | Wt 173.6 lb

## 2023-01-13 DIAGNOSIS — I10 Essential (primary) hypertension: Secondary | ICD-10-CM

## 2023-01-13 DIAGNOSIS — I483 Typical atrial flutter: Secondary | ICD-10-CM | POA: Diagnosis not present

## 2023-01-13 DIAGNOSIS — Z79899 Other long term (current) drug therapy: Secondary | ICD-10-CM | POA: Diagnosis not present

## 2023-01-13 DIAGNOSIS — I5020 Unspecified systolic (congestive) heart failure: Secondary | ICD-10-CM | POA: Diagnosis not present

## 2023-01-13 MED ORDER — BISOPROLOL FUMARATE 5 MG PO TABS: 2.5000 mg | ORAL_TABLET | Freq: Every day | ORAL | 3 refills | Status: DC

## 2023-01-13 MED ORDER — ENTRESTO 24-26 MG PO TABS: 1.0000 | ORAL_TABLET | Freq: Two times a day (BID) | ORAL | 6 refills | Status: DC

## 2023-01-13 NOTE — Patient Instructions (Addendum)
Medication Instructions:  Your physician has recommended you make the following change in your medication:  Stop metoprolol Start bisoprolol 2.5 mg daily Start entrest 24/26 mg twice daily Continue all other medications as prescribed  Labwork: BMET in 7-10 days at [facility] or [facility] ([address] [place]. [place]) Non-fasting  Testing/Procedures: none  Follow-Up: Your physician recommends that you schedule a follow-up appointment in: 4 weeks  Any Other Special Instructions Will Be Listed Below (If Applicable).  If you need a refill on your cardiac medications before your next appointment, please call your pharmacy.

## 2023-01-13 NOTE — Progress Notes (Signed)
Cardiology Office Note  Date: 01/13/2023   ID: Kaylee Gilbert, DOB [birth year], MRN [id number]  History of Present Illness: Kaylee Gilbert is an 82 y.o. female presenting to see me as a patient for the first time (her husband is a patient of mine) following recent hospitalization and consultation with Dr. Jenene Slicker.  I reviewed records, she was admitted for evaluation of fatigue and rapid heart rate, diagnosed with typical atrial flutter with RVR and an associated cardiomyopathy that was suspected to be potentially tachycardia induced.  She spontaneously converted to sinus rhythm with rate control and was started on Eliquis for stroke prophylaxis with CHA2DS2-VASc score of 5.  LVEF 25 to 30% by echocardiogram.  Baseline medical history includes hypertension and hypothyroidism.  She is here today with her husband.  States that she feels better, although not back to baseline.  Ankle swelling has improved.  Generally NYHA class II dyspnea with most activities.  Does not feel a sense of palpitations.  She has been checking her blood pressure and heart rate at home.  Systolics in the 130s to 140s.  I reviewed her medications today and we discussed adjustments aimed at treatment of cardiomyopathy.  ECG today shows sinus rhythm with left bundle branch block.  Physical Exam: VS:  BP (!) 148/76 (BP Location: Right Arm)   Pulse 62   Ht 5\' 4"  (1.626 m)   Wt 173 lb 9.6 oz (78.7 kg)   SpO2 96%   BMI 29.80 kg/m , BMI Body mass index is 29.8 kg/m.  Wt Readings from Last 3 Encounters:  01/13/23 173 lb 9.6 oz (78.7 kg)  01/04/23 175 lb 7.8 oz (79.6 kg)  06/27/15 181 lb (82.1 kg)    General: Patient appears comfortable at rest. HEENT: Conjunctiva and lids normal. Neck: Supple, no elevated JVP or carotid bruits. Lungs: Clear to auscultation, nonlabored breathing at rest. Cardiac: Regular rate and rhythm, no S3 or significant systolic murmur. Extremities: No pitting edema.  ECG:  An ECG  dated 01/04/2023 was personally reviewed today and demonstrated:  Sinus rhythm with prolonged PR interval and left bundle branch block.  Labwork: 12/31/2022: B Natriuretic Peptide 229.0 01/01/2023: ALT 75; AST 41; TSH 2.410 01/04/2023: BUN 30; Creatinine, Ser 1.07; Hemoglobin 11.0; Magnesium 2.1; Platelets 183; Potassium 3.8; Sodium 140   Other Studies Reviewed Today:  Echocardiogram 01/01/2023:  1. LVEF is depressed with global hypokinesis worse in the inferior,  septal and apical walls . Left ventricular ejection fraction, by  estimation, is 25 to 30%. The left ventricle has severely decreased  function. There is mild left ventricular  hypertrophy. Left ventricular diastolic parameters are indeterminate.   2. Right ventricular systolic function is moderately reduced. The right  ventricular size is normal.   3. The mitral valve is normal in structure. Trivial mitral valve  regurgitation.   4. The aortic valve is normal in structure. Aortic valve regurgitation is  not visualized.   5. The inferior vena cava is dilated in size with <50% respiratory  variability, suggesting right atrial pressure of 15 mmHg.   Assessment and Plan:  1.  HFrEF, recently diagnosed with LVEF 25 to 30%.  Potentially tachycardia induced in the setting of newly documented typical atrial flutter with RVR.  She is in sinus rhythm today, NYHA class II dyspnea and fluid status generally improved.  Plan to switch from Lopressor to bisoprolol 2.5 mg daily and start Entresto 24/26 mg twice daily.  Check BMET in 10 days.  Clinical visit  in 1 month.  May be able to add SGLT2 inhibitor plus/minus Aldactone next.  She has Lasix to be used as needed.  Would anticipate follow-up LVEF in the next 2 to 3 months once GDMT optimized.  2.  Typical atrial flutter with recent RVR, spontaneously converted to sinus rhythm with rate control.  CHA2DS2-VASc score is 5.  She is on Eliquis for stroke prophylaxis, tolerating without spontaneous  bleeding problems.  She is in sinus rhythm today by ECG.  May need to consider addition of amiodarone if she has breakthrough arrhythmia, for now we will optimize GDMT for treatment of cardiomyopathy.  3.  Essential hypertension.  Continue medical therapy adjustments as above.  4.  History of hypothyroidism, recent TSH 2.41.  She is on Synthroid.  Disposition:  Follow up  1 month.  Signed, Jonelle Sidle, M.D., F.A.C.C. [facility] at [facility]

## 2023-01-22 ENCOUNTER — Telehealth: Payer: Self-pay | Admitting: Cardiology

## 2023-01-22 DIAGNOSIS — I5020 Unspecified systolic (congestive) heart failure: Secondary | ICD-10-CM | POA: Diagnosis not present

## 2023-01-22 DIAGNOSIS — Z79899 Other long term (current) drug therapy: Secondary | ICD-10-CM | POA: Diagnosis not present

## 2023-01-22 NOTE — Telephone Encounter (Signed)
New Message:     Patient wants to know if she needs or cn take her medicine beforre getting her lab work this morning?

## 2023-01-22 NOTE — Telephone Encounter (Signed)
Advised that she could eat and take medications before her lab work this morning. Verbalized understanding.

## 2023-01-29 ENCOUNTER — Telehealth: Payer: Self-pay

## 2023-01-29 DIAGNOSIS — E875 Hyperkalemia: Secondary | ICD-10-CM

## 2023-01-29 DIAGNOSIS — Z79899 Other long term (current) drug therapy: Secondary | ICD-10-CM

## 2023-01-29 NOTE — Telephone Encounter (Signed)
-----   Message from Nona Dell sent at 01/27/2023  3:33 PM EDT ----- Results reviewed.  Potassium mildly elevated at 5.2, creatinine stable at 1.04.  She is not on a potassium supplement at this time nor she on Aldactone.  Continue Entresto for now, but would recheck a BMET in 10 days.

## 2023-01-29 NOTE — Telephone Encounter (Signed)
Results discussed with pt.She will repeat bmet at [facility] on 02/08/23,pcp copied

## 2023-02-03 DIAGNOSIS — I482 Chronic atrial fibrillation, unspecified: Secondary | ICD-10-CM | POA: Diagnosis not present

## 2023-02-03 DIAGNOSIS — I5020 Unspecified systolic (congestive) heart failure: Secondary | ICD-10-CM | POA: Diagnosis not present

## 2023-02-08 ENCOUNTER — Other Ambulatory Visit (HOSPITAL_COMMUNITY)
Admission: RE | Admit: 2023-02-08 | Discharge: 2023-02-08 | Disposition: A | Discharge status: Home / Self Care | Payer: Medicare Other | Source: Ambulatory Visit | Attending: Cardiology | Admitting: Cardiology

## 2023-02-08 DIAGNOSIS — Z79899 Other long term (current) drug therapy: Secondary | ICD-10-CM | POA: Diagnosis not present

## 2023-02-08 DIAGNOSIS — E875 Hyperkalemia: Secondary | ICD-10-CM | POA: Diagnosis not present

## 2023-02-08 LAB — BASIC METABOLIC PANEL
Anion gap: 9 (ref 5–15)
BUN: 27 mg/dL — ABNORMAL HIGH (ref 8–23)
CO2: 25 mmol/L (ref 22–32)
Calcium: 9.4 mg/dL (ref 8.9–10.3)
Chloride: 106 mmol/L (ref 98–111)
Creatinine, Ser: 0.89 mg/dL (ref 0.44–1.00)
GFR, Estimated: >60 mL/min (ref >60)
Glucose, Bld: 91 mg/dL (ref 70–99)
Potassium: 4.4 mmol/L (ref 3.5–5.1)
Sodium: 140 mmol/L (ref 135–145)

## 2023-02-10 ENCOUNTER — Ambulatory Visit: Payer: Medicare Other | Attending: Cardiology | Admitting: Cardiology

## 2023-02-10 ENCOUNTER — Encounter: Payer: Self-pay | Admitting: Cardiology

## 2023-02-10 VITALS — BP 118/80 | HR 55 | Ht 64.5 in | Wt 173.8 lb

## 2023-02-10 DIAGNOSIS — I5020 Unspecified systolic (congestive) heart failure: Secondary | ICD-10-CM

## 2023-02-10 DIAGNOSIS — I10 Essential (primary) hypertension: Secondary | ICD-10-CM | POA: Diagnosis not present

## 2023-02-10 DIAGNOSIS — I483 Typical atrial flutter: Secondary | ICD-10-CM | POA: Diagnosis not present

## 2023-02-10 MED ORDER — DAPAGLIFLOZIN PROPANEDIOL 10 MG PO TABS: 10.0000 mg | ORAL_TABLET | Freq: Every day | ORAL | 11 refills | Status: DC

## 2023-02-10 NOTE — Addendum Note (Signed)
Addended by: Leonides Schanz C on: 02/10/2023 02:59 PM   Modules accepted: Orders

## 2023-02-10 NOTE — Patient Instructions (Addendum)
Medication Instructions:  Your physician has recommended you make the following change in your medication:   -Start Farxiga 10 mg tablets once daily  *If you need a refill on your cardiac medications before your next appointment, please call your pharmacy*   Lab Work: None If you have labs (blood work) drawn today and your tests are completely normal, you will receive your results only by: MyChart Message (if you have MyChart) OR A paper copy in the mail If you have any lab test that is abnormal or we need to change your treatment, we will call you to review the results.   Testing/Procedures: In 6 Weeks: Your physician has requested that you have an echocardiogram. Echocardiography is a painless test that uses sound waves to create images of your heart. It provides your doctor with information about the size and shape of your heart and how well your heart's chambers and valves are working. This procedure takes approximately one hour. There are no restrictions for this procedure. Please do NOT wear cologne, perfume, aftershave, or lotions (deodorant is allowed). Please arrive 15 minutes prior to your appointment time.    Follow-Up: At [facility], you and your health needs are our priority.  As part of our continuing mission to provide you with exceptional heart care, we have created designated Provider Care Teams.  These Care Teams include your primary Cardiologist (physician) and Advanced Practice Providers (APPs -  Physician Assistants and Nurse Practitioners) who all work together to provide you with the care you need, when you need it.  We recommend signing up for the patient portal called "MyChart".  Sign up information is provided on this After Visit Summary.  MyChart is used to connect with patients for Virtual Visits (Telemedicine).  Patients are able to view lab/test results, encounter notes, upcoming appointments, etc.  Non-urgent messages can be sent to your provider  as well.   To learn more about what you can do with MyChart, go to [email or web address].    Your next appointment:   6 week(s)  Provider:   Nona Dell, MD    Other Instructions

## 2023-02-10 NOTE — Progress Notes (Signed)
    Cardiology Office Note  Date: 02/10/2023   ID: Kaylee Gilbert, DOB 1941-01-26, MRN [id number]  History of Present Illness: Kaylee Gilbert is a 82 y.o. female last seen in August.  She is here for a follow-up visit.  Overall continues to do reasonably well.  She does not describe any fluid retention, has not had to use any Lasix.  Weight is stable today.  No exertional chest pain and NYHA class II dyspnea.  Recent follow-up lab work is outlined below.  We went over her medications as well and discussed addition of SGLT2 inhibitor.  We will likely plan on echocardiogram in about 6 weeks for reevaluation of LVEF.  Physical Exam: VS:  BP 118/80   Pulse (!) 55   Ht 5' 4.5" (1.638 m)   Wt 173 lb 12.8 oz (78.8 kg)   SpO2 98%   BMI 29.37 kg/m , BMI Body mass index is 29.37 kg/m.  Wt Readings from Last 3 Encounters:  02/10/23 173 lb 12.8 oz (78.8 kg)  01/13/23 173 lb 9.6 oz (78.7 kg)  01/04/23 175 lb 7.8 oz (79.6 kg)    General: Patient appears comfortable at rest. HEENT: Conjunctiva and lids normal. Neck: Supple, no elevated JVP or carotid bruits. Lungs: Clear to auscultation, nonlabored breathing at rest. Cardiac: Regular rate and rhythm, no S3 or significant systolic murmur. Extremities: No pitting edema.    ECG:  An ECG dated 01/13/2023 was personally reviewed today and demonstrated:  Sinus rhythm with left bundle branch block.  Labwork: 12/31/2022: B Natriuretic Peptide 229.0 01/01/2023: ALT 75; AST 41; TSH 2.410 01/04/2023: Hemoglobin 11.0; Magnesium 2.1; Platelets 183 02/08/2023: BUN 27; Creatinine, Ser 0.89; Potassium 4.4; Sodium 140   Other Studies Reviewed Today:  Echocardiogram 01/01/2023:  1. LVEF is depressed with global hypokinesis worse in the inferior,  septal and apical walls . Left ventricular ejection fraction, by  estimation, is 25 to 30%. The left ventricle has severely decreased  function. There is mild left ventricular  hypertrophy. Left ventricular  diastolic parameters are indeterminate.   2. Right ventricular systolic function is moderately reduced. The right  ventricular size is normal.   3. The mitral valve is normal in structure. Trivial mitral valve  regurgitation.   4. The aortic valve is normal in structure. Aortic valve regurgitation is  not visualized.   5. The inferior vena cava is dilated in size with <50% respiratory  variability, suggesting right atrial pressure of 15 mmHg.  Assessment and Plan:  1.  HFrEF, recently diagnosed with LVEF 25 to 30%.  Potentially tachycardia induced in the setting of newly documented typical atrial flutter with RVR.  She is clinically stable with NYHA class II dyspnea, no significant fluid retention, and stable weight.  Continue bisoprolol and Entresto, adding SGLT2 inhibitor next.  She has as needed Lasix available, but has not had to use any as yet.  We will plan to reevaluate LVEF with echocardiogram in 6 weeks.   2.  Typical atrial flutter with recent RVR, spontaneously converted to sinus rhythm with rate control.  CHA2DS2-VASc score is 5.  She is on Eliquis for stroke prophylaxis, tolerating without spontaneous bleeding problems.  No obvious recurrent arrhythmia.  Continue bisoprolol.   3.  Essential hypertension.  Blood pressure well-controlled.   Disposition:  Follow up  6 weeks.  Signed, Jonelle Sidle, M.D., F.A.C.C. [facility] at [facility]

## 2023-03-01 DIAGNOSIS — E782 Mixed hyperlipidemia: Secondary | ICD-10-CM | POA: Diagnosis not present

## 2023-03-01 DIAGNOSIS — I10 Essential (primary) hypertension: Secondary | ICD-10-CM | POA: Diagnosis not present

## 2023-03-01 DIAGNOSIS — I5020 Unspecified systolic (congestive) heart failure: Secondary | ICD-10-CM | POA: Diagnosis not present

## 2023-03-01 DIAGNOSIS — K2100 Gastro-esophageal reflux disease with esophagitis, without bleeding: Secondary | ICD-10-CM | POA: Diagnosis not present

## 2023-03-01 DIAGNOSIS — E039 Hypothyroidism, unspecified: Secondary | ICD-10-CM | POA: Diagnosis not present

## 2023-03-01 DIAGNOSIS — N1831 Chronic kidney disease, stage 3a: Secondary | ICD-10-CM | POA: Diagnosis not present

## 2023-03-01 DIAGNOSIS — J301 Allergic rhinitis due to pollen: Secondary | ICD-10-CM | POA: Diagnosis not present

## 2023-03-01 DIAGNOSIS — I4891 Unspecified atrial fibrillation: Secondary | ICD-10-CM | POA: Diagnosis not present

## 2023-03-22 ENCOUNTER — Ambulatory Visit: Payer: Medicare Other | Attending: Cardiology

## 2023-03-22 DIAGNOSIS — I5020 Unspecified systolic (congestive) heart failure: Secondary | ICD-10-CM

## 2023-03-22 LAB — ECHOCARDIOGRAM COMPLETE
AR max vel: 1.91 cm2
AV Area VTI: 1.83 cm2
AV Area mean vel: 1.83 cm2
AV Mean grad: 4 mm[Hg]
AV Peak grad: 7.6 mm[Hg]
Ao pk vel: 1.38 m/s
Area-P 1/2: 3.24 cm2
Calc EF: 53.8 %
MV VTI: 1.84 cm2
S' Lateral: 3.5 cm
Single Plane A2C EF: 57.5 %
Single Plane A4C EF: 53.9 %

## 2023-03-24 ENCOUNTER — Ambulatory Visit: Payer: Medicare Other | Attending: Cardiology | Admitting: Cardiology

## 2023-03-24 ENCOUNTER — Encounter: Payer: Self-pay | Admitting: Cardiology

## 2023-03-24 VITALS — BP 138/78 | HR 53 | Ht 64.5 in | Wt 176.2 lb

## 2023-03-24 DIAGNOSIS — I483 Typical atrial flutter: Secondary | ICD-10-CM

## 2023-03-24 DIAGNOSIS — I10 Essential (primary) hypertension: Secondary | ICD-10-CM

## 2023-03-24 DIAGNOSIS — I5032 Chronic diastolic (congestive) heart failure: Secondary | ICD-10-CM

## 2023-03-24 NOTE — Progress Notes (Signed)
Cardiology Office Note  Date: 03/24/2023   ID: Kaylee Gilbert, DOB [birth year], MRN [id number]  History of Present Illness: Kaylee Gilbert is an 82 y.o. female last seen in September.  She is here for a follow-up visit.  States that she has been doing well, NYHA class I dyspnea, no fluid retention.  She has not had any significant palpitations.  I reviewed her medications.  Current cardiac regimen includes Eliquis, bisoprolol, Farxiga, Crestor, [place], and as needed Lasix.  She has not had to take any diuretic in the interim.  Recent follow-up echocardiogram shows improvement in LVEF to the range of 50 to 55%.  Physical Exam: VS:  BP 138/78 (BP Location: Left Arm, Patient Position: Sitting, Cuff Size: Normal)   Pulse (!) 53   Ht 5' 4.5" (1.638 m)   Wt 176 lb 3.2 oz (79.9 kg)   SpO2 97%   BMI 29.78 kg/m , BMI Body mass index is 29.78 kg/m.  Wt Readings from Last 3 Encounters:  03/24/23 176 lb 3.2 oz (79.9 kg)  02/10/23 173 lb 12.8 oz (78.8 kg)  01/13/23 173 lb 9.6 oz (78.7 kg)    General: Patient appears comfortable at rest. HEENT: Conjunctiva and lids normal. Neck: Supple, no elevated JVP. Lungs: Clear to auscultation, nonlabored breathing at rest. Cardiac: Regular rate and rhythm, no S3 or significant systolic murmur. Extremities: No pitting edema.  ECG:  An ECG dated 01/13/2023 was personally reviewed today and demonstrated:  Sinus rhythm with left bundle branch block.  Labwork: 12/31/2022: B Natriuretic Peptide 229.0 01/01/2023: ALT 75; AST 41; TSH 2.410 01/04/2023: Hemoglobin 11.0; Magnesium 2.1; Platelets 183 02/08/2023: BUN 27; Creatinine, Ser 0.89; Potassium 4.4; Sodium 140   Other Studies Reviewed Today:  Echocardiogram 03/22/2023:  1. Left ventricular ejection fraction, by estimation, is 50 to 55%. The  left ventricle has low normal function. The left ventricle has no regional  wall motion abnormalities. Left ventricular diastolic parameters are  consistent  with Grade I diastolic  dysfunction (impaired relaxation).   2. Right ventricular systolic function is normal. The right ventricular  size is normal. Tricuspid regurgitation signal is inadequate for assessing  PA pressure.   3. The mitral valve is normal in structure. No evidence of mitral valve  regurgitation. No evidence of mitral stenosis.   4. The aortic valve is tricuspid. Aortic valve regurgitation is not  visualized. No aortic stenosis is present.   5. The inferior vena cava is normal in size with greater than 50%  respiratory variability, suggesting right atrial pressure of 3 mmHg.   Assessment and Plan:  1.  HFrecEF, recent follow-up echocardiogram showing improvement in LVEF to the range of 50 to 55%.  Potentially tachycardia induced in the setting of typical atrial flutter with RVR.  She is clinically stable, NYHA class I dyspnea and no fluid retention.  Would continue with medical therapy and observation.  Currently on bisoprolol, Clifton Custard, and as needed Lasix.   2.  Typical atrial flutter with RVR documented in August, spontaneously converted to sinus rhythm with rate control.  CHA2DS2-VASc score is 5.  She is on Eliquis for stroke prophylaxis.  Continue bisoprolol.  She does not report any palpitations to suggest breakthrough arrhythmia.  Can consider EP consultation regarding possibility of ablation if rhythm remains a recurrent issue.   3.  Primary hypertension.  No changes made to present regimen.  She tracks blood pressure at home.  Disposition:  Follow up  6 months.  Signed, Remi Deter  Kaylee Gilbert, M.D., F.A.C.C. [facility] at [facility]

## 2023-03-24 NOTE — Patient Instructions (Signed)
Medication Instructions:  Your physician recommends that you continue on your current medications as directed. Please refer to the Current Medication list given to you today. None3 *If you need a refill on your cardiac medications before your next appointment, please call your pharmacy*   Lab Work: None If you have labs (blood work) drawn today and your tests are completely normal, you will receive your results only by: MyChart Message (if you have MyChart) OR A paper copy in the mail If you have any lab test that is abnormal or we need to change your treatment, we will call you to review the results.   Testing/Procedures: None   Follow-Up: At [facility], you and your health needs are our priority.  As part of our continuing mission to provide you with exceptional heart care, we have created designated Provider Care Teams.  These Care Teams include your primary Cardiologist (physician) and Advanced Practice Providers (APPs -  Physician Assistants and Nurse Practitioners) who all work together to provide you with the care you need, when you need it.  We recommend signing up for the patient portal called "MyChart".  Sign up information is provided on this After Visit Summary.  MyChart is used to connect with patients for Virtual Visits (Telemedicine).  Patients are able to view lab/test results, encounter notes, upcoming appointments, etc.  Non-urgent messages can be sent to your provider as well.   To learn more about what you can do with MyChart, go to [email or web address].    Your next appointment:   6 month(s)  Provider:   Nona Dell, MD    Other Instructions

## 2023-04-07 ENCOUNTER — Telehealth: Payer: Self-pay | Admitting: Cardiology

## 2023-04-07 MED ORDER — APIXABAN 5 MG PO TABS: 5.0000 mg | ORAL_TABLET | Freq: Two times a day (BID) | ORAL | 1 refills | Status: DC

## 2023-04-07 NOTE — Telephone Encounter (Signed)
*  STAT* If patient is at the pharmacy, call can be transferred to refill team.   1. Which medications need to be refilled? (please list name of each medication and dose if known) apixaban (ELIQUIS) 5 MG TABS tablet   2. Which pharmacy/location (including street and city if local pharmacy) is medication to be sent to? [facility] [phone] - [place], [place] - [address] AT [facility] [address] Phone: [phone]  Fax: [phone]       3. Do they need a 30 day or 90 day supply? 90

## 2023-04-07 NOTE — Telephone Encounter (Signed)
Prescription refill request for Eliquis received. Indication: A Flutter Last office visit: 03/24/23  Ival Bible MD Scr: 0.89 on 02/08/23  Epic Age: 82 Weight: 79.9kg  Based on above findings Eliquis 5mg  twice daily is the appropriate dose.  Refill approved.

## 2023-04-24 DIAGNOSIS — Z23 Encounter for immunization: Secondary | ICD-10-CM | POA: Diagnosis not present

## 2023-05-05 DIAGNOSIS — N1831 Chronic kidney disease, stage 3a: Secondary | ICD-10-CM | POA: Diagnosis not present

## 2023-05-05 DIAGNOSIS — R739 Hyperglycemia, unspecified: Secondary | ICD-10-CM | POA: Diagnosis not present

## 2023-05-05 DIAGNOSIS — E559 Vitamin D deficiency, unspecified: Secondary | ICD-10-CM | POA: Diagnosis not present

## 2023-05-05 DIAGNOSIS — I509 Heart failure, unspecified: Secondary | ICD-10-CM | POA: Diagnosis not present

## 2023-05-05 DIAGNOSIS — E7849 Other hyperlipidemia: Secondary | ICD-10-CM | POA: Diagnosis not present

## 2023-05-05 DIAGNOSIS — E038 Other specified hypothyroidism: Secondary | ICD-10-CM | POA: Diagnosis not present

## 2023-05-12 DIAGNOSIS — Z0001 Encounter for general adult medical examination with abnormal findings: Secondary | ICD-10-CM | POA: Diagnosis not present

## 2023-05-12 DIAGNOSIS — I5020 Unspecified systolic (congestive) heart failure: Secondary | ICD-10-CM | POA: Diagnosis not present

## 2023-05-12 DIAGNOSIS — I700 Atherosclerosis of aorta: Secondary | ICD-10-CM | POA: Diagnosis not present

## 2023-05-12 DIAGNOSIS — I482 Chronic atrial fibrillation, unspecified: Secondary | ICD-10-CM | POA: Diagnosis not present

## 2023-05-12 DIAGNOSIS — E039 Hypothyroidism, unspecified: Secondary | ICD-10-CM | POA: Diagnosis not present

## 2023-05-12 DIAGNOSIS — Z23 Encounter for immunization: Secondary | ICD-10-CM | POA: Diagnosis not present

## 2023-05-12 DIAGNOSIS — J301 Allergic rhinitis due to pollen: Secondary | ICD-10-CM | POA: Diagnosis not present

## 2023-05-12 DIAGNOSIS — E7849 Other hyperlipidemia: Secondary | ICD-10-CM | POA: Diagnosis not present

## 2023-05-12 DIAGNOSIS — K2100 Gastro-esophageal reflux disease with esophagitis, without bleeding: Secondary | ICD-10-CM | POA: Diagnosis not present

## 2023-05-12 DIAGNOSIS — I10 Essential (primary) hypertension: Secondary | ICD-10-CM | POA: Diagnosis not present

## 2023-06-23 ENCOUNTER — Other Ambulatory Visit: Payer: Self-pay | Admitting: Cardiology

## 2023-08-23 DIAGNOSIS — R051 Acute cough: Secondary | ICD-10-CM | POA: Diagnosis not present

## 2023-08-23 DIAGNOSIS — J019 Acute sinusitis, unspecified: Secondary | ICD-10-CM | POA: Diagnosis not present

## 2023-08-30 DIAGNOSIS — N1831 Chronic kidney disease, stage 3a: Secondary | ICD-10-CM | POA: Diagnosis not present

## 2023-08-30 DIAGNOSIS — E7849 Other hyperlipidemia: Secondary | ICD-10-CM | POA: Diagnosis not present

## 2023-08-30 DIAGNOSIS — Z131 Encounter for screening for diabetes mellitus: Secondary | ICD-10-CM | POA: Diagnosis not present

## 2023-08-30 DIAGNOSIS — I10 Essential (primary) hypertension: Secondary | ICD-10-CM | POA: Diagnosis not present

## 2023-09-08 DIAGNOSIS — I10 Essential (primary) hypertension: Secondary | ICD-10-CM | POA: Diagnosis not present

## 2023-09-08 DIAGNOSIS — I482 Chronic atrial fibrillation, unspecified: Secondary | ICD-10-CM | POA: Diagnosis not present

## 2023-09-08 DIAGNOSIS — E782 Mixed hyperlipidemia: Secondary | ICD-10-CM | POA: Diagnosis not present

## 2023-09-08 DIAGNOSIS — N1831 Chronic kidney disease, stage 3a: Secondary | ICD-10-CM | POA: Diagnosis not present

## 2023-09-14 DIAGNOSIS — I700 Atherosclerosis of aorta: Secondary | ICD-10-CM | POA: Diagnosis not present

## 2023-09-14 DIAGNOSIS — N1831 Chronic kidney disease, stage 3a: Secondary | ICD-10-CM | POA: Diagnosis not present

## 2023-09-14 DIAGNOSIS — I10 Essential (primary) hypertension: Secondary | ICD-10-CM | POA: Diagnosis not present

## 2023-09-14 DIAGNOSIS — R748 Abnormal levels of other serum enzymes: Secondary | ICD-10-CM | POA: Diagnosis not present

## 2023-09-14 DIAGNOSIS — Z131 Encounter for screening for diabetes mellitus: Secondary | ICD-10-CM | POA: Diagnosis not present

## 2023-10-13 ENCOUNTER — Ambulatory Visit: Attending: Cardiology | Admitting: Cardiology

## 2023-10-13 ENCOUNTER — Encounter: Payer: Self-pay | Admitting: Cardiology

## 2023-10-13 VITALS — BP 140/80 | HR 62 | Ht 65.0 in | Wt 171.6 lb

## 2023-10-13 DIAGNOSIS — I5032 Chronic diastolic (congestive) heart failure: Secondary | ICD-10-CM

## 2023-10-13 DIAGNOSIS — E782 Mixed hyperlipidemia: Secondary | ICD-10-CM

## 2023-10-13 DIAGNOSIS — I483 Typical atrial flutter: Secondary | ICD-10-CM

## 2023-10-13 DIAGNOSIS — I10 Essential (primary) hypertension: Secondary | ICD-10-CM | POA: Diagnosis not present

## 2023-10-13 MED ORDER — ROSUVASTATIN CALCIUM 5 MG PO TABS: 5.0000 mg | ORAL_TABLET | Freq: Every day | ORAL | 3 refills | Status: AC

## 2023-10-13 NOTE — Patient Instructions (Signed)
 Medication Instructions:  Re-start Crestor  5 mg daily at bedtime  Continue all other medications  Labwork: None today  Testing/Procedures: None today  Follow-Up: 6 months  Any Other Special Instructions Will Be Listed Below (If Applicable).  If you need a refill on your cardiac medications before your next appointment, please call your pharmacy.

## 2023-10-13 NOTE — Progress Notes (Signed)
    Cardiology Office Note  Date: 10/13/2023   ID: Kaylee Gilbert, DOB 12-04-1940, MRN [id number]  History of Present Illness: Kaylee Gilbert is an 83 y.o. female last seen in October 2024.  She is here for a follow-up visit.  Overall doing reasonably well, reports NYHA class II dyspnea with most activities, no palpitations or exertional chest pain.  We went over her medications.  She recalls being told to stop Crestor  at her last hospital discharge, although per review of the records it was actually Cozaar.  She states that her lipids have increased since then, we are going to get [facility] work [facility] and I have asked her to go back and resume Crestor  at 5 mg daily.  The remainder of her cardiac regimen is stable.  Physical Exam: VS:  BP (!) 160/88 (BP Location: Left Arm)   Pulse 62   Ht 5\' 5"  (1.651 m)   Wt 171 lb 9.6 oz (77.8 kg)   SpO2 100%   BMI 28.56 kg/m , BMI Body mass index is 28.56 kg/m.  Wt Readings from Last 3 Encounters:  10/13/23 171 lb 9.6 oz (77.8 kg)  03/24/23 176 lb 3.2 oz (79.9 kg)  02/10/23 173 lb 12.8 oz (78.8 kg)    General: Patient appears comfortable at rest. HEENT: Conjunctiva and lids normal. Neck: Supple, no elevated JVP or carotid bruits. Lungs: Clear to auscultation, nonlabored breathing at rest. Cardiac: Regular rate and rhythm, no S3 or significant systolic murmur. Extremities: No pitting edema.  ECG:  An ECG dated 01/13/2023 was personally reviewed today and demonstrated:  Sinus rhythm with left bundle branch block.  Labwork: 12/31/2022: B Natriuretic Peptide 229.0 01/01/2023: ALT 75; AST 41; TSH 2.410 01/04/2023: Hemoglobin 11.0; Magnesium  2.1; Platelets 183 02/08/2023: BUN 27; Creatinine, Ser 0.89; Potassium 4.4; Sodium 140   Other Studies Reviewed Today:  No interval cardiac testing for review today.  Assessment and Plan:  1.  HFrecEF, echocardiogram in October 2024 showed LVEF 50 to 55%.  Potentially tachycardia induced in the  setting of typical atrial flutter with RVR.  He is clinically stable with NYHA class II symptoms and no fluid retention.  Continue bisoprolol  2.5 mg daily, Farxiga  10 mg daily, Lasix  20 mg as needed, and Entresto  24/26 mg twice daily.  Requesting interval lab work from PCP.   2.  Typical atrial flutter with RVR documented in August 2024, spontaneously converted to sinus rhythm with rate control.  CHA2DS2-VASc score is 5.  She does not report any recurring palpitations and her heart rate is regular today.  Continue Eliquis  5 mg twice daily for stroke prophylaxis.   3.  Primary hypertension.  Continue to check blood pressure at home, suspect she has a component of whitecoat hypertension based on reported measurements.  4.  Mixed hyperlipidemia.  Plan to resume Crestor  5 mg daily as discussed above.  Disposition:  Follow up 6 months.  Signed, Gerard Knight, M.D., F.A.C.C. [facility] at [facility]

## 2023-11-01 ENCOUNTER — Other Ambulatory Visit: Payer: Self-pay | Admitting: Cardiology

## 2023-11-01 NOTE — Telephone Encounter (Signed)
 Prescription refill request for Eliquis  received. Indication:aflutter Last office visit:5/25 Scr:0.89  9/24 Age: 83 Weight:77.8  kg  Prescription refilled

## 2023-12-04 ENCOUNTER — Other Ambulatory Visit: Payer: Self-pay | Admitting: Cardiology

## 2024-02-03 ENCOUNTER — Other Ambulatory Visit: Payer: Self-pay | Admitting: Cardiology

## 2024-03-21 ENCOUNTER — Other Ambulatory Visit: Payer: Self-pay | Admitting: Cardiology

## 2024-05-07 ENCOUNTER — Encounter (HOSPITAL_COMMUNITY): Payer: Self-pay | Admitting: *Deleted

## 2024-05-07 ENCOUNTER — Other Ambulatory Visit: Payer: Self-pay

## 2024-05-07 ENCOUNTER — Emergency Department (HOSPITAL_COMMUNITY)

## 2024-05-07 ENCOUNTER — Emergency Department (HOSPITAL_COMMUNITY)
Admission: EM | Admit: 2024-05-07 | Discharge: 2024-05-07 | Disposition: A | Discharge status: Home / Self Care | Attending: Emergency Medicine | Admitting: Emergency Medicine

## 2024-05-07 DIAGNOSIS — S0181XA Laceration without foreign body of other part of head, initial encounter: Secondary | ICD-10-CM

## 2024-05-07 DIAGNOSIS — S060XAA Concussion with loss of consciousness status unknown, initial encounter: Secondary | ICD-10-CM

## 2024-05-07 MED ORDER — LIDOCAINE-EPINEPHRINE (PF) 2 %-1:200000 IJ SOLN
20.0000 mL | Freq: Once | INTRAMUSCULAR | Status: AC
  Administered 2024-05-07: 20 mL via INTRADERMAL
  Filled 2024-05-07: qty 20

## 2024-05-07 MED ORDER — LIDOCAINE-EPINEPHRINE 2 %-1:100000 IJ SOLN: 20.0000 mL | Freq: Once | INTRAMUSCULAR | Status: DC

## 2024-05-07 MED ORDER — MUPIROCIN CALCIUM 2 % EX CREA
TOPICAL_CREAM | Freq: Two times a day (BID) | CUTANEOUS | Status: DC
  Filled 2024-05-07: qty 15

## 2024-05-07 MED ORDER — MUPIROCIN 2 % EX OINT: 1.0000 | TOPICAL_OINTMENT | Freq: Two times a day (BID) | CUTANEOUS | 0 refills | Status: AC

## 2024-05-07 NOTE — Discharge Instructions (Signed)
 You had 3 sutures, these have your doctor take them out in about 1 week, Tylenol  or Motrin for pain, take a topical antibiotic such as mupirocin  which I prescribed and apply to the wound twice a day, keep a sterile dressing over this.  Return for pain fevers redness swelling or pus from the wound

## 2024-05-07 NOTE — ED Provider Notes (Signed)
 [place] EMERGENCY DEPARTMENT AT [facility] Provider Note   CSN: [id number] Arrival date & time: 05/07/24  1227     Patient presents with: Kaylee   LOUIE Gilbert is a 83 y.o. female.    Fall   This patient is a 83 year old female presenting with a complaint of a head injury, she states she was outside getting ready to go to church when she slipped falling onto the concrete, this was a mechanical fall when she lost her balance on a slippery surface.  She struck the left side of her forehead against a concrete and then laid on the ground for 20 minutes waiting for her husband to come outside and find her.  The patient reports that there was no loss of consciousness, she went to the urgent care initially but because she takes Eliquis  for atrial fibrillation they redirected her to the emergency department.  She does not have a headache or nausea and has no numbness or weakness of the arms or the legs.  She had some difficulty moving her left arm but states she has chronic arthritis and she is at her baseline.  She tried to get up but because of her left arm chronic arthritis and the slippery surface she did not get up until someone came to help her.    Prior to Admission medications   Medication Sig Start Date End Date Taking? Authorizing Provider  mupirocin  ointment (BACTROBAN ) 2 % Apply 1 Application topically 2 (two) times daily. 05/07/24  Yes Cleotilde Rogue, MD  acetaminophen  (TYLENOL ) 325 MG tablet Take 650 mg by mouth every 6 (six) hours as needed for headache.    [provider]  BIOTIN PO Take 1 tablet by mouth daily.    [provider]  bisoprolol  (ZEBETA ) 5 MG tablet TAKE 1/2 TABLET(2.5 MG) BY MOUTH DAILY 12/07/23   Debera Jayson KANDICE, MD  cetirizine (ZYRTEC) 10 MG tablet Take 10 mg by mouth daily as needed for allergies.    [provider]  Cholecalciferol (VITAMIN D-3 PO) Take 1 tablet by mouth daily.    [provider]   Cyanocobalamin ([employer] VITAMIN B12 PO) Take 1 tablet by mouth daily.    [provider]  ELIQUIS  5 MG TABS tablet TAKE 1 TABLET(5 MG) BY MOUTH TWICE DAILY 11/01/23   Debera Jayson KANDICE, MD  FARXIGA  10 MG TABS tablet TAKE 1 TABLET(10 MG) BY MOUTH DAILY BEFORE BREAKFAST 03/21/24   Debera Jayson KANDICE, MD  furosemide  (LASIX ) 20 MG tablet TAKE 1 TABLET BY MOUTH DAILY AS NEEDED FOR EDEMA OR FLUID(SHORTNESS OF BREATH) 03/21/24   Debera Jayson KANDICE, MD  levothyroxine  (SYNTHROID ) 88 MCG tablet Take 1 tablet by mouth daily before breakfast.    [provider]  omeprazole (PRILOSEC) 20 MG capsule Take 20 mg by mouth daily as needed. 04/28/19   [provider]  rosuvastatin  (CRESTOR ) 5 MG tablet Take 1 tablet (5 mg total) by mouth daily. 10/13/23   Debera Jayson KANDICE, MD  sacubitril-valsartan (ENTRESTO ) 24-26 MG TAKE 1 TABLET BY MOUTH TWICE DAILY 02/03/24   Debera Jayson KANDICE, MD  ZINC OXIDE PO Take 1 tablet by mouth daily.    [provider]    Allergies: Metaxalone    Review of Systems  All other systems reviewed and are negative.   Updated Vital Signs BP (!) 165/63   Pulse (!) 59   Temp (!) 97.3 F (36.3 C) (Temporal)   Resp 20   Ht 1.651 m (5'  5)   Wt 77.1 kg   SpO2 96%   BMI 28.29 kg/m   Physical Exam Vitals and nursing note reviewed.  Constitutional:      General: She is not in acute distress.    Appearance: She is well-developed.  HENT:     Head: Normocephalic.     Comments: 2 cm laceration to the left forehead above the left eyebrow.    Mouth/Throat:     Pharynx: No oropharyngeal exudate.  Eyes:     General: No scleral icterus.       Right eye: No discharge.        Left eye: No discharge.     Conjunctiva/sclera: Conjunctivae normal.     Pupils: Pupils are equal, round, and reactive to light.  Neck:     Thyroid : No thyromegaly.     Vascular: No JVD.  Cardiovascular:     Rate and Rhythm: Normal rate and regular rhythm.     Heart sounds:  Normal heart sounds. No murmur heard.    No friction rub. No gallop.  Pulmonary:     Effort: Pulmonary effort is normal. No respiratory distress.     Breath sounds: Normal breath sounds. No wheezing or rales.  Abdominal:     General: Bowel sounds are normal. There is no distension.     Palpations: Abdomen is soft. There is no mass.     Tenderness: There is no abdominal tenderness.  Musculoskeletal:        General: Tenderness present. Normal range of motion.     Cervical back: Normal range of motion and neck supple.     Comments: Mild tenderness over the left forehead, no other extremity injuries, she has full range of motion of all 4 extremities, slight abrasions over the hand on the left  Lymphadenopathy:     Cervical: No cervical adenopathy.  Skin:    General: Skin is warm and dry.     Findings: No erythema or rash.  Neurological:     Mental Status: She is alert.     Coordination: Coordination normal.     Comments: Speech is clear, cranial nerves III through XII are intact, memory is intact, strength is normal in all 4 extremities including grips and strength at the bilateral thighs, knees and ankles to extention and flexion, sensation is intact to light touch and pinprick in all 4 extremities. Coordination as tested by finger-nose-finger is normal, no limb ataxia. Normal gait, normal reflexes at the patellar tendons bilaterally  Psychiatric:        Behavior: Behavior normal.     (all labs ordered are listed, but only abnormal results are displayed) Labs Reviewed - No data to display  EKG: None  Radiology: CT Cervical Spine Wo Contrast Result Date: 05/07/2024 EXAM: CT CERVICAL SPINE WITHOUT CONTRAST 05/07/2024 01:09:05 PM TECHNIQUE: CT of the cervical spine was performed without the administration of intravenous contrast. Multiplanar reformatted images are provided for review. Automated exposure control, iterative reconstruction, and/or weight based adjustment of the mA/kV was  utilized to reduce the radiation dose to as low as reasonably achievable. COMPARISON: None available. CLINICAL HISTORY: Facial trauma, blunt. All on ice. Trauma to the head. FINDINGS: CERVICAL SPINE: BONES AND ALIGNMENT: No acute fracture or traumatic malalignment. DEGENERATIVE CHANGES: Ankylosis of the cervical spine is present at C2-C3, C3-C4, and C4-C5. Uncovertebral spurring leads to moderate foraminal stenosis bilaterally at C5-C6 and C6-C7. Advanced facet hypertrophy and slight anterolisthesis is present at C7-T1. Moderate foraminal narrowing is present bilaterally  at this level. SOFT TISSUES: No prevertebral soft tissue swelling. IMPRESSION: 1. No acute abnormality of the cervical spine. 2. Advanced facet hypertrophy with slight anterolisthesis at C7-T1 and moderate bilateral foraminal narrowing. 3. Ankylosis at C2-3, C3-4, and C4-5. 4. Moderate bilateral foraminal stenosis at C5-6 and C6-7 due to uncovertebral spurring. Electronically signed by: Lonni Necessary MD 05/07/2024 01:22 PM EST RP Workstation: [id number]   CT Head Wo Contrast Result Date: 05/07/2024 EXAM: CT HEAD WITHOUT CONTRAST 05/07/2024 01:09:05 PM TECHNIQUE: CT of the head was performed without the administration of intravenous contrast. Automated exposure control, iterative reconstruction, and/or weight based adjustment of the mA/kV was utilized to reduce the radiation dose to as low as reasonably achievable. COMPARISON: None available. CLINICAL HISTORY: Head trauma, minor, normal mental status (Age 40-64y). FINDINGS: BRAIN AND VENTRICLES: No acute hemorrhage. No evidence of acute infarct. No hydrocephalus. No extra-axial collection. No mass effect or midline shift. Mild atrophy and white matter changes are likely within normal limits for age. ORBITS: Bilateral lens replacements are noted. The globes and orbits are otherwise within normal limits. SINUSES: No acute abnormality. SOFT TISSUES AND SKULL: A left periventricular scalp  laceration and hematoma is present without underlying fracture or foreign body. No skull fracture. IMPRESSION: 1. No acute intracranial abnormality. 2. Left parietal scalp laceration and hematoma without underlying fracture or foreign body. Electronically signed by: Lonni Necessary MD 05/07/2024 01:12 PM EST RP Workstation: [id number]     Procedures   Medications Ordered in the ED  mupirocin  cream (BACTROBAN ) 2 % (has no administration in time range)  lidocaine -EPINEPHrine  (XYLOCAINE  W/EPI) 2 %-1:200000 (PF) injection 20 mL (20 mLs Intradermal Given by Other 05/07/24 1313)                                    Medical Decision Making Amount and/or Complexity of Data Reviewed Radiology: ordered.  Risk Prescription drug management.    This patient presents to the ED for concern of head injury, fall differential diagnosis includes contusion, hematoma, laceration, could have intracranial findings although it seems like a minor injury she is on Eliquis  and hit her head on the concrete so CT will be ordered    Additional history obtained   Additional history obtained from Electronic Medical Record External records from outside source obtained and reviewed including medical records including visits with cardiology and prior admission history from last year   CT scan of the brain without contrast and cervical spineImaging Studies ordered:  I ordered imaging studies including CT head  I independently visualized and interpreted imaging which showed no IC injury I agree with the radiologist interpretation   Medicines ordered and prescription drug management:  I ordered medication including lidocaine  with epinephrine     I have reviewed the patients home medicines and have made adjustments as needed   Problem List / ED Course:  Laceration was expertly repaired by physician assistant, see separate note.  I directly supervised and assisted with this procedure   Social  Determinants of Health:  Patient agreeable to discharge, well-appearing with normal mental status and neurologic exam, she was ambulatory in emergency department without difficulty.  Spouse at the bedside in agreement.        Final diagnoses:  Laceration of skin of face, initial encounter  Concussion with unknown loss of consciousness status, initial encounter    ED Discharge Orders          Ordered  mupirocin  ointment (BACTROBAN ) 2 %  2 times daily        05/07/24 1411               Cleotilde Rogue, MD 05/07/24 1413

## 2024-05-07 NOTE — ED Triage Notes (Addendum)
 Pt slipped on steps that had ice and hit head on cement walkway. Denies LOC, pt is on blood thinners. Pt seen at [facility] and sent here. Pt with lac above left eyebrow. Denies any dizziness, pt alert and oriented

## 2024-05-07 NOTE — ED Provider Notes (Signed)
.  Laceration Repair  Date/Time: 05/07/2024 2:08 PM  Performed by: Rosaline Almarie MATSU, PA-C Authorized by: Rosaline Almarie MATSU, PA-C   Consent:    Consent obtained:  Verbal   Consent given by:  Patient   Risks, benefits, and alternatives were discussed: yes     Risks discussed:  Infection, need for additional repair, nerve damage, poor wound healing, poor cosmetic result, pain, retained foreign body, tendon damage and vascular damage   Alternatives discussed:  No treatment, delayed treatment and observation Universal protocol:    Procedure explained and questions answered to patient or proxy's satisfaction: yes     Relevant documents present and verified: yes     Test results available: yes     Imaging studies available: yes     Required blood products, implants, devices, and special equipment available: yes     Site/side marked: yes     Immediately prior to procedure, a time out was called: yes     Patient identity confirmed:  Verbally with patient Anesthesia:    Anesthesia method:  Local infiltration   Local anesthetic:  Lidocaine  1% w/o epi Laceration details:    Location:  Face   Face location:  L eyebrow   Length (cm):  2 Pre-procedure details:    Preparation:  Patient was prepped and draped in usual sterile fashion and imaging obtained to evaluate for foreign bodies Exploration:    Limited defect created (wound extended): no     Contaminated: no   Treatment:    Area cleansed with:  Povidone-iodine    Amount of cleaning:  Standard   Irrigation solution:  Sterile saline   Irrigation method:  Syringe   Visualized foreign bodies/material removed: no     Debridement:  None   Undermining:  None   Scar revision: no   Skin repair:    Repair method:  Sutures   Suture size:  5-0   Suture material:  Prolene   Suture technique:  Simple interrupted   Number of sutures:  3 Approximation:    Approximation:  Close Repair type:    Repair type:  Simple Post-procedure details:     Dressing:  Non-adherent dressing   Procedure completion:  [place] Rosaline Almarie MATSU, PA-C 05/07/24 1415    Cleotilde Rogue, MD 05/08/24 1200

## 2024-05-11 ENCOUNTER — Other Ambulatory Visit: Payer: Self-pay | Admitting: Cardiology

## 2024-05-11 NOTE — Telephone Encounter (Signed)
 Prescription refill request for Eliquis  received. Indication: afib  Last office visit: [place] 10/13/2023 Scr: 1.02, 08/30/2023 Age: 83 yo  Weight: 77.1 kg   Refill sent

## 2024-06-21 ENCOUNTER — Ambulatory Visit: Attending: Cardiology | Admitting: Cardiology

## 2024-06-21 ENCOUNTER — Encounter: Payer: Self-pay | Admitting: Cardiology

## 2024-06-21 ENCOUNTER — Encounter: Payer: Self-pay | Admitting: *Deleted

## 2024-06-21 VITALS — BP 142/72 | HR 54 | Ht 65.0 in | Wt 171.8 lb

## 2024-06-21 DIAGNOSIS — I5020 Unspecified systolic (congestive) heart failure: Secondary | ICD-10-CM | POA: Diagnosis not present

## 2024-06-21 DIAGNOSIS — E782 Mixed hyperlipidemia: Secondary | ICD-10-CM

## 2024-06-21 DIAGNOSIS — I10 Essential (primary) hypertension: Secondary | ICD-10-CM

## 2024-06-21 DIAGNOSIS — I483 Typical atrial flutter: Secondary | ICD-10-CM | POA: Diagnosis not present

## 2024-06-21 NOTE — Progress Notes (Signed)
"  ° ° °  Cardiology Office Note  Date: 06/21/2024   ID: Kaylee Gilbert, DOB 20-Oct-1940, MRN [id number]  History of Present Illness: Kaylee Gilbert is an 84 y.o. female last seen in May 2025.  She is here today for a follow-up visit.  Reports no palpitations or unusual shortness of breath with typical activities.  No increasing weight or leg edema, no orthopnea or PND.  We went over her medications.  She reports compliance with current regimen, no spontaneous bleeding problems on Eliquis .  States that she had lab work with PCP in December 2025, we are requesting the results.  I rechecked her blood pressure today at 142/72.  She checks blood pressure at home and reports systolics typically in the 120s to 130s.  I reviewed her ECG today which shows sinus bradycardia at 54 bpm, prolonged PR interval, increased voltage, leftward axis, decreased R wave progression.  Physical Exam: VS:  BP (!) 142/72 (BP Location: Left Arm)   Pulse (!) 54   Ht 5' 5 (1.651 m)   Wt 171 lb 12.8 oz (77.9 kg)   SpO2 96%   BMI 28.59 kg/m , BMI Body mass index is 28.59 kg/m.  Wt Readings from Last 3 Encounters:  06/21/24 171 lb 12.8 oz (77.9 kg)  05/07/24 170 lb (77.1 kg)  10/13/23 171 lb 9.6 oz (77.8 kg)    General: Patient appears comfortable at rest. HEENT: Conjunctiva and lids normal. Neck: Supple, no elevated JVP or carotid bruits. Lungs: Clear to auscultation, nonlabored breathing at rest. Cardiac: Regular rate and rhythm, no S3 or significant systolic murmur. Extremities: No pitting edema.  ECG:  An ECG dated 01/13/2023 was personally reviewed today and demonstrated:  Sinus rhythm with left bundle branch block.  Labwork:  No recent lab work available for review today.  Other Studies Reviewed Today:  No interval cardiac testing for review today.  Assessment and Plan:  1.  HFrecEF, echocardiogram in October 2024 showed LVEF 50 to 55%.  Potentially tachycardia induced in the setting of typical  atrial flutter with RVR.  She remains clinically stable, NYHA class I-II dyspnea and no fluid retention.  Continue medical therapy including Zebeta  2.5 mg daily, Farxiga  10 mg daily, and Entresto  24/26 mg twice daily.  She has Lasix  20 mg tablets to use as needed.  Requesting interval lab work from Dr. Toribio.   2.  Typical atrial flutter with RVR documented in August 2024, spontaneously converted to sinus rhythm with rate control.  CHA2DS2-VASc score is 5.  No palpitations and in sinus rhythm today.  Continue Eliquis  5 mg twice daily for stroke prophylaxis.  She denies any spontaneous bleeding problems.   3.  Primary hypertension.  Also component of whitecoat hypertension.  Continue to track blood pressure at home.  No changes made to present regimen.   4.  Mixed hyperlipidemia.  Requesting interval lab work from Dr. Toribio.  Continue Crestor  5 mg daily.  Disposition:  Follow up 6 months.  Signed, Jayson JUDITHANN Sierras, M.D., F.A.C.C. [facility] at [facility]

## 2024-06-21 NOTE — Patient Instructions (Addendum)
